# Patient Record
Sex: Female | Born: 1950 | Race: White | Hispanic: No | Marital: Married | Smoking: Never smoker
Health system: Southern US, Community
[De-identification: ages and names within clinical notes are randomized; demographics above are authoritative.]

## PROBLEM LIST (undated history)

## (undated) DIAGNOSIS — F32A Depression, unspecified: Secondary | ICD-10-CM

## (undated) DIAGNOSIS — T4145XA Adverse effect of unspecified anesthetic, initial encounter: Secondary | ICD-10-CM

## (undated) DIAGNOSIS — E039 Hypothyroidism, unspecified: Secondary | ICD-10-CM

## (undated) DIAGNOSIS — I2510 Atherosclerotic heart disease of native coronary artery without angina pectoris: Secondary | ICD-10-CM

## (undated) DIAGNOSIS — F329 Major depressive disorder, single episode, unspecified: Secondary | ICD-10-CM

## (undated) DIAGNOSIS — T8859XA Other complications of anesthesia, initial encounter: Secondary | ICD-10-CM

## (undated) DIAGNOSIS — C50919 Malignant neoplasm of unspecified site of unspecified female breast: Secondary | ICD-10-CM

## (undated) DIAGNOSIS — R51 Headache: Secondary | ICD-10-CM

## (undated) HISTORY — DX: Atherosclerotic heart disease of native coronary artery without angina pectoris: I25.10

## (undated) HISTORY — PX: OTHER SURGICAL HISTORY: SHX169

## (undated) HISTORY — PX: FACELIFT: SHX1566

## (undated) HISTORY — DX: Malignant neoplasm of unspecified site of unspecified female breast: C50.919

---

## 1998-08-04 ENCOUNTER — Other Ambulatory Visit: Admission: RE | Admit: 1998-08-04 | Discharge: 1998-08-04 | Payer: Self-pay | Admitting: Orthopedic Surgery

## 1998-12-27 ENCOUNTER — Other Ambulatory Visit: Admission: RE | Admit: 1998-12-27 | Discharge: 1998-12-27 | Payer: Self-pay | Admitting: Gynecology

## 1999-06-25 ENCOUNTER — Encounter: Payer: Self-pay | Admitting: Gynecology

## 1999-06-25 ENCOUNTER — Encounter: Admission: RE | Admit: 1999-06-25 | Discharge: 1999-06-25 | Payer: Self-pay | Admitting: Gynecology

## 1999-07-16 ENCOUNTER — Other Ambulatory Visit: Admission: RE | Admit: 1999-07-16 | Discharge: 1999-07-16 | Payer: Self-pay | Admitting: Gynecology

## 1999-09-18 ENCOUNTER — Encounter: Admission: RE | Admit: 1999-09-18 | Discharge: 1999-09-18 | Payer: Self-pay | Admitting: Gynecology

## 1999-09-18 ENCOUNTER — Encounter: Payer: Self-pay | Admitting: Gynecology

## 2000-07-09 ENCOUNTER — Other Ambulatory Visit: Admission: RE | Admit: 2000-07-09 | Discharge: 2000-07-09 | Payer: Self-pay | Admitting: Gynecology

## 2000-08-05 ENCOUNTER — Encounter (INDEPENDENT_AMBULATORY_CARE_PROVIDER_SITE_OTHER): Payer: Self-pay | Admitting: Specialist

## 2000-08-05 ENCOUNTER — Other Ambulatory Visit: Admission: RE | Admit: 2000-08-05 | Discharge: 2000-08-05 | Payer: Self-pay | Admitting: Gynecology

## 2000-09-29 ENCOUNTER — Encounter: Admission: RE | Admit: 2000-09-29 | Discharge: 2000-09-29 | Payer: Self-pay | Admitting: Gynecology

## 2000-09-29 ENCOUNTER — Encounter: Payer: Self-pay | Admitting: Gynecology

## 2001-03-17 ENCOUNTER — Other Ambulatory Visit: Admission: RE | Admit: 2001-03-17 | Discharge: 2001-03-17 | Payer: Self-pay | Admitting: Gynecology

## 2001-04-27 ENCOUNTER — Ambulatory Visit (HOSPITAL_COMMUNITY): Admission: RE | Admit: 2001-04-27 | Discharge: 2001-04-27 | Payer: Self-pay | Admitting: Gastroenterology

## 2001-06-01 ENCOUNTER — Ambulatory Visit (HOSPITAL_COMMUNITY): Admission: RE | Admit: 2001-06-01 | Discharge: 2001-06-01 | Payer: Self-pay | Admitting: Gastroenterology

## 2001-09-17 ENCOUNTER — Other Ambulatory Visit: Admission: RE | Admit: 2001-09-17 | Discharge: 2001-09-17 | Payer: Self-pay | Admitting: Gynecology

## 2001-10-01 ENCOUNTER — Encounter: Payer: Self-pay | Admitting: Gynecology

## 2001-10-01 ENCOUNTER — Encounter: Admission: RE | Admit: 2001-10-01 | Discharge: 2001-10-01 | Payer: Self-pay | Admitting: Gynecology

## 2002-03-17 ENCOUNTER — Other Ambulatory Visit: Admission: RE | Admit: 2002-03-17 | Discharge: 2002-03-17 | Payer: Self-pay | Admitting: Gynecology

## 2002-09-23 ENCOUNTER — Other Ambulatory Visit: Admission: RE | Admit: 2002-09-23 | Discharge: 2002-09-23 | Payer: Self-pay | Admitting: Gynecology

## 2002-10-04 ENCOUNTER — Encounter: Admission: RE | Admit: 2002-10-04 | Discharge: 2002-10-04 | Payer: Self-pay | Admitting: Gynecology

## 2002-10-04 ENCOUNTER — Encounter: Payer: Self-pay | Admitting: Gynecology

## 2003-03-29 ENCOUNTER — Other Ambulatory Visit: Admission: RE | Admit: 2003-03-29 | Discharge: 2003-03-29 | Payer: Self-pay | Admitting: Gynecology

## 2003-09-07 ENCOUNTER — Other Ambulatory Visit: Admission: RE | Admit: 2003-09-07 | Discharge: 2003-09-07 | Payer: Self-pay | Admitting: Gynecology

## 2003-10-05 ENCOUNTER — Encounter: Admission: RE | Admit: 2003-10-05 | Discharge: 2003-10-05 | Payer: Self-pay | Admitting: Gynecology

## 2004-03-13 ENCOUNTER — Other Ambulatory Visit: Admission: RE | Admit: 2004-03-13 | Discharge: 2004-03-13 | Payer: Self-pay | Admitting: Gynecology

## 2004-05-22 ENCOUNTER — Ambulatory Visit: Payer: Self-pay | Admitting: Sports Medicine

## 2004-08-07 ENCOUNTER — Encounter: Admission: RE | Admit: 2004-08-07 | Discharge: 2004-08-07 | Payer: Self-pay | Admitting: Orthopedic Surgery

## 2004-10-08 ENCOUNTER — Encounter: Admission: RE | Admit: 2004-10-08 | Discharge: 2004-10-08 | Payer: Self-pay | Admitting: Gynecology

## 2004-10-17 ENCOUNTER — Other Ambulatory Visit: Admission: RE | Admit: 2004-10-17 | Discharge: 2004-10-17 | Payer: Self-pay | Admitting: Gynecology

## 2005-04-30 ENCOUNTER — Other Ambulatory Visit: Admission: RE | Admit: 2005-04-30 | Discharge: 2005-04-30 | Payer: Self-pay | Admitting: Gynecology

## 2005-07-31 ENCOUNTER — Encounter: Admission: RE | Admit: 2005-07-31 | Discharge: 2005-07-31 | Payer: Self-pay | Admitting: Neurology

## 2005-10-09 ENCOUNTER — Encounter: Admission: RE | Admit: 2005-10-09 | Discharge: 2005-10-09 | Payer: Self-pay | Admitting: Gynecology

## 2005-12-02 ENCOUNTER — Other Ambulatory Visit: Admission: RE | Admit: 2005-12-02 | Discharge: 2005-12-02 | Payer: Self-pay | Admitting: Gynecology

## 2006-10-20 ENCOUNTER — Encounter: Admission: RE | Admit: 2006-10-20 | Discharge: 2006-10-20 | Payer: Self-pay | Admitting: Gynecology

## 2007-01-02 ENCOUNTER — Ambulatory Visit: Payer: Self-pay | Admitting: Sports Medicine

## 2007-01-02 DIAGNOSIS — M21611 Bunion of right foot: Secondary | ICD-10-CM | POA: Insufficient documentation

## 2007-01-02 DIAGNOSIS — M2170 Unequal limb length (acquired), unspecified site: Secondary | ICD-10-CM | POA: Insufficient documentation

## 2007-03-05 HISTORY — PX: KNEE ARTHROSCOPY: SUR90

## 2007-10-29 ENCOUNTER — Encounter: Admission: RE | Admit: 2007-10-29 | Discharge: 2007-10-29 | Payer: Self-pay | Admitting: Gynecology

## 2008-10-31 ENCOUNTER — Encounter: Admission: RE | Admit: 2008-10-31 | Discharge: 2008-10-31 | Payer: Self-pay | Admitting: Gynecology

## 2009-11-01 ENCOUNTER — Encounter: Admission: RE | Admit: 2009-11-01 | Discharge: 2009-11-01 | Payer: Self-pay | Admitting: Gynecology

## 2010-07-17 ENCOUNTER — Other Ambulatory Visit: Payer: Self-pay | Admitting: Gynecology

## 2010-07-17 ENCOUNTER — Ambulatory Visit (HOSPITAL_BASED_OUTPATIENT_CLINIC_OR_DEPARTMENT_OTHER)
Admission: RE | Admit: 2010-07-17 | Discharge: 2010-07-17 | Disposition: A | Discharge status: Home / Self Care | Payer: 59 | Source: Ambulatory Visit | Attending: Gynecology | Admitting: Gynecology

## 2010-07-17 DIAGNOSIS — D250 Submucous leiomyoma of uterus: Secondary | ICD-10-CM | POA: Insufficient documentation

## 2010-07-17 DIAGNOSIS — E039 Hypothyroidism, unspecified: Secondary | ICD-10-CM | POA: Insufficient documentation

## 2010-07-17 DIAGNOSIS — R002 Palpitations: Secondary | ICD-10-CM | POA: Insufficient documentation

## 2010-07-17 DIAGNOSIS — N950 Postmenopausal bleeding: Secondary | ICD-10-CM | POA: Insufficient documentation

## 2010-07-17 DIAGNOSIS — N840 Polyp of corpus uteri: Secondary | ICD-10-CM | POA: Insufficient documentation

## 2010-07-17 DIAGNOSIS — Z79899 Other long term (current) drug therapy: Secondary | ICD-10-CM | POA: Insufficient documentation

## 2010-07-17 LAB — POCT HEMOGLOBIN-HEMACUE: Hemoglobin: 14.5 g/dL (ref 12.0–15.0)

## 2010-07-20 NOTE — Procedures (Signed)
[facility]  Patient:    Crystal Galvan, Crystal Galvan Visit Number: [id number] MRN: [id number]          Service Type: END Location: ENDO Attending Physician:  Dennison Bulla Ii Dictated by:   Verlin Grills, M.D. Proc. Date: 06/01/01 Admit Date:  06/01/2001 Discharge Date: 06/01/2001   CC:         Theressa Millard, M.D.  Gretta Cool, M.D.   Procedure Report  This is a redictation.  PROCEDURE:  Screening colonoscopy.  PROCEDURE INDICATION:  Ms. Crystal Galvan is a 60 year old female born 22-Jul-1950.  She is referred to me by Dr. Theressa Millard for a screening colonoscopy with polypectomy to prevent colon cancer.  I discussed with Crystal Galvan the complications associated with colonoscopy and polypectomy, including a 15 per 1000 risk of bleeding and 4 per 1000 risk of colon perforation requiring surgical repair.  Crystal Galvan has signed the operative permit.  ENDOSCOPIST:  Verlin Grills, M.D.  PREMEDICATION:  Demerol 50 mg, Versed 7 mg.  ENDOSCOPE:  Olympus pediatric colonoscope.  DESCRIPTION OF PROCEDURE:  After obtaining informed consent, Crystal Galvan was placed in the left lateral decubitus position.  I administered intravenous Demerol and intravenous Versed to achieve conscious sedation for the procedure.  The patients blood pressure, oxygen saturation, and cardiac rhythm were monitored throughout the procedure and documented in the medical record.  Anal inspection was normal.  Digital rectal exam was normal.  The Olympus pediatric video colonoscope was introduced into the rectum and easily advanced to the cecum.  Colonic preparation for the exam today was satisfactory.  RECTUM:  Normal.  SIGMOID COLON AND DESCENDING COLON:  Left colonic diverticulosis.  SPLENIC FLEXURE:  Normal.  TRANSVERSE COLON:  Normal.  HEPATIC FLEXURE:  Normal.  ASCENDING COLON:  Normal.  CECUM AND ILEOCECAL VALVE:  Normal.  ASSESSMENT: 1. Left colonic  diverticulosis. 2. Otherwise normal screening proctocolonoscopy to the cecum. 3. No endoscopic evidence for the presence of colorectal neoplasia. Dictated by:   Verlin Grills, M.D. Attending Physician:  Dennison Bulla Ii DD:  06/15/01 TD:  06/15/01 Job: [id number]

## 2010-08-17 NOTE — Op Note (Signed)
  Crystal Galvan, Crystal Galvan                  ACCOUNT NO.:  [id number]  MEDICAL RECORD NO.:  [id number]         PATIENT TYPE:  AMB  LOCATION:  [facility]                         FACILITY:  [facility]  PHYSICIAN:  Gretta Cool, M.D. DATE OF BIRTH:  [birth year]  DATE OF PROCEDURE:  07/17/2010 DATE OF DISCHARGE:                              OPERATIVE REPORT   PREOPERATIVE DIAGNOSIS:  Submucous leiomyomata with postmenopausal bleeding, recurrent and previous resection of submucous fibroid in 1996.  SURGEON:  Gretta Cool, MD  POSTOPERATIVE DIAGNOSIS:  Submucous leiomyomata with postmenopausal bleeding, recurrent and previous resection of submucous fibroid in 1996.  ANESTHESIA:  IV sedation, paracervical block.  DESCRIPTION OF PROCEDURE:  Under satisfactory anesthesia as above with the patient prepped and draped in [employer] with her bladder drained preoperatively, the weighted speculum placed in the vagina and cervix grasped with a single-tooth tenaculum, pulled down and viewed. It was then anesthetized by paracervical block with 1% Xylocaine 15 cc. At this point, the cervix was progressively dilated with a series of Pratt dilators to 33.  Hysteroscope was then introduced and cavity examined.  There was recurrence of the submucous fibroid.  She had one previously resected totally in 1996.  There was one that now fills the entire cavity attached to the anterior fundal wall.  The submucous fibroids that were progressively resected so as to remove it entirely. Much of the endometrial wall also removed in the process of removing the submucous fibroid.  There was no other evidence of fibroids bulging into the cavity.  At this point, we reduced pressure.  There was no further bleeding.  There were no polyps or other evidence of abnormality in the endometrial cavity.  Photographs were taken.  At the end of the procedure, there was no significant bleeding.  The fluid deficit is approximately 140  cc.  COMPLICATIONS:  None.          ______________________________ Gretta Cool, M.D.     CWL/MEDQ  D:  07/17/2010  T:  07/17/2010  Job:  [id number]  cc:   Theressa Millard, M.D. Fax: [phone]  Electronically Signed by Beather Arbour M.D. on 08/17/2010 11:50:31 AM

## 2010-09-25 ENCOUNTER — Other Ambulatory Visit: Payer: Self-pay | Admitting: Gynecology

## 2010-09-25 DIAGNOSIS — Z1231 Encounter for screening mammogram for malignant neoplasm of breast: Secondary | ICD-10-CM

## 2010-11-09 ENCOUNTER — Ambulatory Visit
Admission: RE | Admit: 2010-11-09 | Discharge: 2010-11-09 | Disposition: A | Discharge status: Home / Self Care | Payer: 59 | Source: Ambulatory Visit | Attending: Gynecology | Admitting: Gynecology

## 2010-11-09 DIAGNOSIS — Z1231 Encounter for screening mammogram for malignant neoplasm of breast: Secondary | ICD-10-CM

## 2011-01-02 ENCOUNTER — Other Ambulatory Visit: Payer: Self-pay | Admitting: Gynecology

## 2011-10-01 ENCOUNTER — Other Ambulatory Visit: Payer: Self-pay | Admitting: Gynecology

## 2011-10-01 DIAGNOSIS — Z1231 Encounter for screening mammogram for malignant neoplasm of breast: Secondary | ICD-10-CM

## 2011-11-12 ENCOUNTER — Other Ambulatory Visit: Payer: Self-pay | Admitting: Gynecology

## 2011-11-12 ENCOUNTER — Ambulatory Visit
Admission: RE | Admit: 2011-11-12 | Discharge: 2011-11-12 | Disposition: A | Discharge status: Home / Self Care | Payer: 59 | Source: Ambulatory Visit | Attending: Gynecology | Admitting: Gynecology

## 2011-11-12 ENCOUNTER — Ambulatory Visit
Admission: RE | Admit: 2011-11-12 | Discharge: 2011-11-12 | Disposition: A | Discharge status: Home / Self Care | Payer: 59 | Source: Ambulatory Visit

## 2011-11-12 DIAGNOSIS — Z1231 Encounter for screening mammogram for malignant neoplasm of breast: Secondary | ICD-10-CM

## 2012-02-04 ENCOUNTER — Other Ambulatory Visit: Payer: Self-pay | Admitting: Gynecology

## 2012-04-30 ENCOUNTER — Other Ambulatory Visit: Payer: Self-pay | Admitting: Gastroenterology

## 2012-05-22 ENCOUNTER — Encounter (HOSPITAL_COMMUNITY): Payer: Self-pay | Admitting: *Deleted

## 2012-05-28 ENCOUNTER — Encounter (HOSPITAL_COMMUNITY): Payer: Self-pay | Admitting: Pharmacy Technician

## 2012-06-01 ENCOUNTER — Encounter (HOSPITAL_COMMUNITY)
Admission: RE | Disposition: A | Discharge status: Home / Self Care | Payer: Self-pay | Source: Ambulatory Visit | Attending: Gastroenterology

## 2012-06-01 ENCOUNTER — Encounter (HOSPITAL_COMMUNITY): Payer: Self-pay | Admitting: *Deleted

## 2012-06-01 ENCOUNTER — Ambulatory Visit (HOSPITAL_COMMUNITY)
Admission: RE | Admit: 2012-06-01 | Discharge: 2012-06-01 | Disposition: A | Discharge status: Home / Self Care | Payer: BC Managed Care – PPO | Source: Ambulatory Visit | Attending: Gastroenterology | Admitting: Gastroenterology

## 2012-06-01 ENCOUNTER — Ambulatory Visit (HOSPITAL_COMMUNITY): Payer: BC Managed Care – PPO | Admitting: Anesthesiology

## 2012-06-01 ENCOUNTER — Encounter (HOSPITAL_COMMUNITY): Payer: Self-pay | Admitting: Anesthesiology

## 2012-06-01 DIAGNOSIS — K5730 Diverticulosis of large intestine without perforation or abscess without bleeding: Secondary | ICD-10-CM | POA: Insufficient documentation

## 2012-06-01 DIAGNOSIS — Z881 Allergy status to other antibiotic agents status: Secondary | ICD-10-CM | POA: Insufficient documentation

## 2012-06-01 DIAGNOSIS — Z79899 Other long term (current) drug therapy: Secondary | ICD-10-CM | POA: Insufficient documentation

## 2012-06-01 DIAGNOSIS — E039 Hypothyroidism, unspecified: Secondary | ICD-10-CM | POA: Insufficient documentation

## 2012-06-01 DIAGNOSIS — E559 Vitamin D deficiency, unspecified: Secondary | ICD-10-CM | POA: Insufficient documentation

## 2012-06-01 DIAGNOSIS — Z1211 Encounter for screening for malignant neoplasm of colon: Secondary | ICD-10-CM | POA: Insufficient documentation

## 2012-06-01 HISTORY — DX: Other complications of anesthesia, initial encounter: T88.59XA

## 2012-06-01 HISTORY — DX: Headache: R51

## 2012-06-01 HISTORY — DX: Major depressive disorder, single episode, unspecified: F32.9

## 2012-06-01 HISTORY — DX: Adverse effect of unspecified anesthetic, initial encounter: T41.45XA

## 2012-06-01 HISTORY — DX: Depression, unspecified: F32.A

## 2012-06-01 HISTORY — DX: Hypothyroidism, unspecified: E03.9

## 2012-06-01 HISTORY — PX: COLONOSCOPY WITH PROPOFOL: SHX5780

## 2012-06-01 SURGERY — COLONOSCOPY WITH PROPOFOL
Anesthesia: Monitor Anesthesia Care

## 2012-06-01 MED ORDER — ONDANSETRON HCL 4 MG/2ML IJ SOLN
INTRAMUSCULAR | Status: DC | PRN
  Administered 2012-06-01: 4 mg via INTRAVENOUS

## 2012-06-01 MED ORDER — MIDAZOLAM HCL 5 MG/5ML IJ SOLN
INTRAMUSCULAR | Status: DC | PRN
  Administered 2012-06-01 (×2): 1 mg via INTRAVENOUS

## 2012-06-01 MED ORDER — PROPOFOL 10 MG/ML IV EMUL
INTRAVENOUS | Status: DC | PRN
  Administered 2012-06-01: 140 ug/kg/min via INTRAVENOUS

## 2012-06-01 MED ORDER — LACTATED RINGERS IV SOLN
INTRAVENOUS | Status: DC
  Administered 2012-06-01: 1000 mL via INTRAVENOUS

## 2012-06-01 MED ORDER — KETAMINE HCL 10 MG/ML IJ SOLN
INTRAMUSCULAR | Status: DC | PRN
  Administered 2012-06-01: 10 mg via INTRAVENOUS

## 2012-06-01 MED ORDER — FENTANYL CITRATE 0.05 MG/ML IJ SOLN
INTRAMUSCULAR | Status: DC | PRN
  Administered 2012-06-01 (×2): 50 ug via INTRAVENOUS

## 2012-06-01 MED ORDER — SODIUM CHLORIDE 0.9 % IV SOLN: INTRAVENOUS | Status: DC

## 2012-06-01 SURGICAL SUPPLY — 22 items

## 2012-06-01 NOTE — Anesthesia Preprocedure Evaluation (Signed)
Anesthesia Evaluation  Patient identified by MRN, date of birth, ID band Patient awake    Reviewed: Allergy & Precautions, H&P , NPO status , Patient's Chart, lab work & pertinent test results  Airway Mallampati: II TM Distance: >3 FB Neck ROM: Full    Dental no notable dental hx.    Pulmonary neg pulmonary ROS,  breath sounds clear to auscultation  Pulmonary exam normal       Cardiovascular negative cardio ROS  Rhythm:Regular Rate:Normal     Neuro/Psych negative neurological ROS  negative psych ROS   GI/Hepatic negative GI ROS, Neg liver ROS,   Endo/Other  negative endocrine ROSHypothyroidism   Renal/GU negative Renal ROS  negative genitourinary   Musculoskeletal negative musculoskeletal ROS (+)   Abdominal   Peds negative pediatric ROS (+)  Hematology negative hematology ROS (+)   Anesthesia Other Findings Upper front veneers  Reproductive/Obstetrics negative OB ROS                           Anesthesia Physical Anesthesia Plan  ASA: II  Anesthesia Plan: MAC   Post-op Pain Management:    Induction:   Airway Management Planned: Simple Face Mask  Additional Equipment:   Intra-op Plan:   Post-operative Plan:   Informed Consent: I have reviewed the patients History and Physical, chart, labs and discussed the procedure including the risks, benefits and alternatives for the proposed anesthesia with the patient or authorized representative who has indicated his/her understanding and acceptance.   Dental advisory given  Plan Discussed with: CRNA  Anesthesia Plan Comments:         Anesthesia Quick Evaluation

## 2012-06-01 NOTE — Anesthesia Postprocedure Evaluation (Signed)
  Anesthesia Post-op Note  Patient: Crystal Galvan  Procedure(s) Performed: Procedure(s) (LRB): COLONOSCOPY WITH PROPOFOL (N/A)  Patient Location: PACU  Anesthesia Type: MAC  Level of Consciousness: awake and alert   Airway and Oxygen Therapy: Patient Spontanous Breathing  Post-op Pain: mild  Post-op Assessment: Post-op Vital signs reviewed, Patient's Cardiovascular Status Stable, Respiratory Function Stable, Patent Airway and No signs of Nausea or vomiting  Last Vitals:  Filed Vitals:   06/01/12 1326  BP:   Temp: 36.6 C  Resp:     Post-op Vital Signs: stable   Complications: No apparent anesthesia complications

## 2012-06-01 NOTE — Transfer of Care (Signed)
Immediate Anesthesia Transfer of Care Note  Patient: Crystal Galvan  Procedure(s) Performed: Procedure(s): COLONOSCOPY WITH PROPOFOL (N/A)  Patient Location: PACU  Anesthesia Type:MAC  Level of Consciousness: awake, alert  and oriented  Airway & Oxygen Therapy: Patient Spontanous Breathing and Patient connected to face mask oxygen  Post-op Assessment: Report given to PACU RN and Post -op Vital signs reviewed and stable  Post vital signs: Reviewed and stable  Complications: No apparent anesthesia complications

## 2012-06-01 NOTE — H&P (Signed)
  Procedure repeat screening colonoscopy  History: The patient is a 62 year old female born Sep 16, 1950. The patient underwent a normal screening colonoscopy in March 2003. She is scheduled to undergo a repeat screening colonoscopy with polypectomy to prevent colon cancer.  Chronic medications: Prometrium. Fish oil. Cymbalta. Levothyroxin. Fluticasone. Vivelle-Dot.  Past medical history: Vitamin D. deficiency. Hypothyroidism. History of diverticulitis. Rhinoplasty.  Habits: The patient has never smoked cigarettes and consumes alcohol in moderation.  Allergies: Erythromycin causes nausea. Septra caused an uncharacterized side effect   Plan: Proceed with screening colonoscopy.

## 2012-06-01 NOTE — Op Note (Signed)
Procedure: Screening colonoscopy  Endoscopist: Danise Edge  Premedication: Propofol administered by anesthesia  Procedure: The patient was placed in the left lateral decubitus position. Anal inspection and digital rectal exam were normal. The Pentax pediatric colonoscope was introduced into the rectum and easily advanced to the cecum. A normal-appearing ileocecal valve and appendiceal orifice were identified. Colonic preparation for the exam today was good.  Rectum. Normal. Retroflexed view of the distal rectum normal.  Sigmoid colon and descending colon. Left colonic diverticulosis  Splenic flexure. Normal.  Transverse colon. Normal.  Hepatic flexure. Normal.  Ascending colon. Normal.  Cecum and ileocecal valve. Normal.  Assessment: Normal screening proctocolonoscopy to the cecum  Recommendations: Schedule repeat screening colonoscopy in 10 years

## 2012-06-01 NOTE — Preoperative (Signed)
Beta Blockers   Reason not to administer Beta Blockers:Not Applicable 

## 2012-06-02 ENCOUNTER — Encounter (HOSPITAL_COMMUNITY): Payer: Self-pay | Admitting: Gastroenterology

## 2012-09-16 ENCOUNTER — Ambulatory Visit (INDEPENDENT_AMBULATORY_CARE_PROVIDER_SITE_OTHER): Payer: BC Managed Care – PPO | Admitting: Sports Medicine

## 2012-09-16 VITALS — BP 128/77 | Ht 68.0 in | Wt 140.0 lb

## 2012-09-16 DIAGNOSIS — S39012A Strain of muscle, fascia and tendon of lower back, initial encounter: Secondary | ICD-10-CM

## 2012-09-16 DIAGNOSIS — S335XXA Sprain of ligaments of lumbar spine, initial encounter: Secondary | ICD-10-CM

## 2012-09-16 MED ORDER — KETOROLAC TROMETHAMINE 60 MG/2ML IM SOLN
60.0000 mg | Freq: Once | INTRAMUSCULAR | Status: AC
  Administered 2012-09-16: 60 mg via INTRAMUSCULAR

## 2012-09-16 MED ORDER — KETOROLAC TROMETHAMINE 60 MG/2ML IM SOLN: 60.0000 mg | Freq: Once | INTRAMUSCULAR | Status: DC

## 2012-09-16 NOTE — Progress Notes (Signed)
  Subjective:    Patient ID: Crystal Galvan, female    DOB: 1950-06-25, 62 y.o.   MRN: [id number]  HPI chief complaint: Low back pain   62 year old female comes in today complaining of 2 days of low back pain. She suffered an acute injury while playing tennis. Had immediate onset of right-sided low back pain and began to develop spasming thereafter. She has tried chiropractic treatment as well as acupuncture but her spasms continue. She will get some radiating discomfort around to the front of her hip but she denies radiating pain down the legs. No associated numbness or tingling. No change in bowel or bladder. No prior problems with her back in the past. The spasm is worse when sitting or with turning over in bed. She is been using heat which has been helpful and has also been using over-the-counter Aleve 1 pill when necessary which she also notes to be of some benefit.  Past medical history is reviewed Medications are reviewed No known drug allergies    Review of Systems     Objective:   Physical Exam Well-developed, mild distressed due to her pain. Awake alert and oriented x3. Vital signs are reviewed  Patient has limited lumbar range of motion secondary to pain. There is some mild spasm of the right paraspinal musculature. No tenderness to palpation or percussion along the lumbar midline. Negative straight leg raise bilaterally. Negative log roll bilaterally. Strength is 5/5 both lower extremities. Reflexes are 2/4 at the Achilles and patellar tendons bilaterally. Station is intact to light touch grossly. Patient walks with a very upright posture due to her pain.       Assessment & Plan:  1. Low back pain secondary to lumbar strain with muscle spasm  60 mg of Toradol IM. I've asked her to increase her Aleve to 2 pills twice daily with food for the next 2-3 days. Continue with heat but I recommended moist heat over dry heat. We talked about the possibility of physical therapy if symptoms  persist. Gentle range of motion as symptoms allow. Followup if symptoms persist or worsen.

## 2012-09-17 ENCOUNTER — Ambulatory Visit (INDEPENDENT_AMBULATORY_CARE_PROVIDER_SITE_OTHER): Payer: BC Managed Care – PPO | Admitting: Sports Medicine

## 2012-09-17 VITALS — BP 149/69 | Ht 68.0 in | Wt 140.0 lb

## 2012-09-17 DIAGNOSIS — S335XXA Sprain of ligaments of lumbar spine, initial encounter: Secondary | ICD-10-CM

## 2012-09-17 DIAGNOSIS — S39012A Strain of muscle, fascia and tendon of lower back, initial encounter: Secondary | ICD-10-CM

## 2012-09-17 MED ORDER — METHYLPREDNISOLONE ACETATE 80 MG/ML IJ SUSP
80.0000 mg | Freq: Once | INTRAMUSCULAR | Status: AC
  Administered 2012-09-17: 80 mg via INTRAMUSCULAR

## 2012-09-17 NOTE — Progress Notes (Signed)
Patient ID: Crystal Galvan, female   DOB: October 28, 1950, 62 y.o.   MRN: [id number]  Crystal Galvan comes in today for cortisone injection. We did discuss this possibility during yesterday's visit. She has received about 50% improvement with the IM Toradol injection which was administered yesterday she would like to go ahead with the cortisone injection as well. She is leaving for a trip to the [place] tomorrow. We will go ahead and inject her with 80 mg of Depo-Medrol IM. If symptoms persist thereafter code consider formal physical therapy upon her return to [place]. Otherwise, followup when necessary.

## 2012-10-30 ENCOUNTER — Other Ambulatory Visit: Payer: Self-pay

## 2012-11-06 ENCOUNTER — Other Ambulatory Visit: Payer: Self-pay

## 2012-11-06 DIAGNOSIS — Z1231 Encounter for screening mammogram for malignant neoplasm of breast: Secondary | ICD-10-CM

## 2012-12-01 ENCOUNTER — Ambulatory Visit
Admission: RE | Admit: 2012-12-01 | Discharge: 2012-12-01 | Disposition: A | Discharge status: Home / Self Care | Payer: BC Managed Care – PPO | Source: Ambulatory Visit

## 2012-12-01 DIAGNOSIS — Z1231 Encounter for screening mammogram for malignant neoplasm of breast: Secondary | ICD-10-CM

## 2013-11-01 ENCOUNTER — Other Ambulatory Visit: Payer: Self-pay

## 2013-11-01 DIAGNOSIS — Z1231 Encounter for screening mammogram for malignant neoplasm of breast: Secondary | ICD-10-CM

## 2013-12-14 ENCOUNTER — Ambulatory Visit: Payer: BC Managed Care – PPO

## 2013-12-15 ENCOUNTER — Ambulatory Visit
Admission: RE | Admit: 2013-12-15 | Discharge: 2013-12-15 | Disposition: A | Discharge status: Home / Self Care | Payer: BC Managed Care – PPO | Source: Ambulatory Visit

## 2013-12-15 DIAGNOSIS — Z1231 Encounter for screening mammogram for malignant neoplasm of breast: Secondary | ICD-10-CM

## 2014-01-14 ENCOUNTER — Other Ambulatory Visit: Payer: Self-pay | Admitting: Internal Medicine

## 2014-01-14 DIAGNOSIS — R1032 Left lower quadrant pain: Secondary | ICD-10-CM

## 2014-01-19 ENCOUNTER — Ambulatory Visit
Admission: RE | Admit: 2014-01-19 | Discharge: 2014-01-19 | Disposition: A | Discharge status: Home / Self Care | Payer: BC Managed Care – PPO | Source: Ambulatory Visit | Attending: Internal Medicine | Admitting: Internal Medicine

## 2014-01-19 DIAGNOSIS — R1032 Left lower quadrant pain: Secondary | ICD-10-CM

## 2014-05-03 ENCOUNTER — Other Ambulatory Visit: Payer: Self-pay | Admitting: Dermatology

## 2014-11-21 ENCOUNTER — Other Ambulatory Visit: Payer: Self-pay

## 2014-11-21 DIAGNOSIS — Z1231 Encounter for screening mammogram for malignant neoplasm of breast: Secondary | ICD-10-CM

## 2014-12-07 ENCOUNTER — Ambulatory Visit (INDEPENDENT_AMBULATORY_CARE_PROVIDER_SITE_OTHER): Payer: BLUE CROSS/BLUE SHIELD | Admitting: Family Medicine

## 2014-12-07 ENCOUNTER — Encounter: Payer: Self-pay | Admitting: Family Medicine

## 2014-12-07 VITALS — BP 120/69 | HR 60 | Ht 68.0 in | Wt 138.0 lb

## 2014-12-07 DIAGNOSIS — R269 Unspecified abnormalities of gait and mobility: Secondary | ICD-10-CM | POA: Insufficient documentation

## 2014-12-07 NOTE — Progress Notes (Signed)
  Crystal Galvan - 64 y.o. female MRN [id number]  Date of birth: 1950-06-17 Crystal Galvan is a 64 y.o. female who presents today for abnormality of gait.  Abnormality of gait, initial visit-patient presents today for orthotics to correct her underlying gait abnormality. She has pes planus with slight collapse the transverse arch and splaying of her toes. She has a slight external rotation of the right leg with ambulation. She does play a decent amount of tenderness and is looking for a cushioned orthotic to help with this to decrease back pain.  PMHx - Updated and reviewed.  Contributory factors include: Bunion, hypothyroidism PSHx - Updated and reviewed.  Contributory factors include:  Right knee arthroscopy with meniscectomy in 2009 FHx - Updated and reviewed.  Contributory factors include:  Noncontributory Medications - vitamin D, estradiol, fish oil, Synthroid   ROS Per HPI. 12 point negative otherwise Exam:  Filed Vitals:   12/07/14 1416  BP: 120/69  Pulse: 60   Gen: NAD Cardiorespiratory - Normal respiratory effort/rate.  RRR  feet: Pes planus right greater than left with collapse of transverse arch. She has no areas of ecchymosis or soft tissue edema. Full range of motion in the ankles and toes. Muscle strength 5 out of 5 bilateral lower extremity. Neurovascular intact distally.

## 2014-12-07 NOTE — Assessment & Plan Note (Signed)
Patient was fitted for a standard, cushioned, semi-rigid orthotic. The orthotic was heated and afterward the patient stood on the orthotic blank positioned on the orthotic stand. The patient was positioned in subtalar neutral position and 10 degrees of ankle dorsiflexion in a weight bearing stance. After completion of molding, a stable base was applied to the orthotic blank. The blank was ground to a stable position for weight bearing. Size: 8 Base: Blue EVA Additional Posting and Padding: None The patient ambulated these, and they were very comfortable.  I spent 45 minutes with this patient, greater than 50% was face-to-face time counseling regarding the below diagnosis.  Her orthotic helped correct her gait with less functional pes planus and external rotation of the R leg.

## 2014-12-28 ENCOUNTER — Ambulatory Visit
Admission: RE | Admit: 2014-12-28 | Discharge: 2014-12-28 | Disposition: A | Discharge status: Home / Self Care | Payer: BLUE CROSS/BLUE SHIELD | Source: Ambulatory Visit

## 2014-12-28 DIAGNOSIS — Z1231 Encounter for screening mammogram for malignant neoplasm of breast: Secondary | ICD-10-CM

## 2015-07-03 DIAGNOSIS — L700 Acne vulgaris: Secondary | ICD-10-CM | POA: Diagnosis not present

## 2015-07-14 DIAGNOSIS — M47812 Spondylosis without myelopathy or radiculopathy, cervical region: Secondary | ICD-10-CM | POA: Diagnosis not present

## 2015-07-24 DIAGNOSIS — F331 Major depressive disorder, recurrent, moderate: Secondary | ICD-10-CM | POA: Diagnosis not present

## 2015-08-02 ENCOUNTER — Encounter: Payer: Self-pay | Admitting: Sports Medicine

## 2015-08-02 ENCOUNTER — Ambulatory Visit (INDEPENDENT_AMBULATORY_CARE_PROVIDER_SITE_OTHER): Payer: BLUE CROSS/BLUE SHIELD | Admitting: Sports Medicine

## 2015-08-02 VITALS — BP 117/62 | HR 60 | Ht 68.0 in | Wt 135.0 lb

## 2015-08-02 DIAGNOSIS — M21611 Bunion of right foot: Secondary | ICD-10-CM | POA: Diagnosis not present

## 2015-08-02 NOTE — Assessment & Plan Note (Signed)
There is moderate inflammatory change over great toe  Consider taping to stabilize during tennis  Orthotics had a 1st ray post and medial heel wedge added to RT only  Try arnica gel tid to toe  I think best conservative options are wide toe box for shoes and good orthotic support/ advised

## 2015-08-02 NOTE — Progress Notes (Signed)
Patient ID: Crystal Galvan, female   DOB: 04/22/1950, 65 y.o.   MRN: [id number]  Patient here for recheck of foot pain/ bunion  Placed in orthotics 12/2014 that have worked well She had used orthotics for years before when exercising However with tennis more Great toe pain  Saw Dr Kern Reap when RT great toe crossing under 2nd toe Manipulation and he was able to get this back straighter Tried kinesio taping and had some pain relief with that Pain relates to dragging toe on forehand stroke in tennis More narrow toe box in older pair of tennis shoes seemed to really worsen this  Soc Hx:  Married/ plays tennis and works out regularly  ROS No other joint related swelling No hx of gout  Pexam NAD BP 117/62 mmHg  Pulse 60  Ht 5\' 8"  (1.727 m)  Wt 135 lb (61.236 kg)  BMI 20.53 kg/m2  RT foot Moderate arch This foot Mild valgus deviation This tends to displace 2nd toe upward at times TTP and mild swelling over MTP 1  LT foot Good motion left great toe No valgus shift Moderate arch  [place] Spurring and bony change at MTP 1 mostly dorsal and medial Small inflamed bursa over dorsal surface with increased doppler flow

## 2015-08-08 DIAGNOSIS — R5381 Other malaise: Secondary | ICD-10-CM | POA: Diagnosis not present

## 2015-08-08 DIAGNOSIS — N951 Menopausal and female climacteric states: Secondary | ICD-10-CM | POA: Diagnosis not present

## 2015-08-08 DIAGNOSIS — E559 Vitamin D deficiency, unspecified: Secondary | ICD-10-CM | POA: Diagnosis not present

## 2015-08-08 DIAGNOSIS — E039 Hypothyroidism, unspecified: Secondary | ICD-10-CM | POA: Diagnosis not present

## 2015-08-28 DIAGNOSIS — Z85828 Personal history of other malignant neoplasm of skin: Secondary | ICD-10-CM | POA: Diagnosis not present

## 2015-08-28 DIAGNOSIS — L820 Inflamed seborrheic keratosis: Secondary | ICD-10-CM | POA: Diagnosis not present

## 2015-08-28 DIAGNOSIS — L570 Actinic keratosis: Secondary | ICD-10-CM | POA: Diagnosis not present

## 2015-08-28 DIAGNOSIS — L720 Epidermal cyst: Secondary | ICD-10-CM | POA: Diagnosis not present

## 2015-08-30 DIAGNOSIS — L659 Nonscarring hair loss, unspecified: Secondary | ICD-10-CM | POA: Diagnosis not present

## 2015-08-30 DIAGNOSIS — L708 Other acne: Secondary | ICD-10-CM | POA: Diagnosis not present

## 2015-08-30 DIAGNOSIS — E039 Hypothyroidism, unspecified: Secondary | ICD-10-CM | POA: Diagnosis not present

## 2015-09-14 DIAGNOSIS — F331 Major depressive disorder, recurrent, moderate: Secondary | ICD-10-CM | POA: Diagnosis not present

## 2015-10-30 DIAGNOSIS — E039 Hypothyroidism, unspecified: Secondary | ICD-10-CM | POA: Diagnosis not present

## 2015-11-13 DIAGNOSIS — E7210 Disorders of sulfur-bearing amino-acid metabolism, unspecified: Secondary | ICD-10-CM | POA: Diagnosis not present

## 2015-11-13 DIAGNOSIS — E039 Hypothyroidism, unspecified: Secondary | ICD-10-CM | POA: Diagnosis not present

## 2015-12-12 ENCOUNTER — Other Ambulatory Visit: Payer: Self-pay | Admitting: Obstetrics and Gynecology

## 2015-12-12 DIAGNOSIS — Z1231 Encounter for screening mammogram for malignant neoplasm of breast: Secondary | ICD-10-CM

## 2015-12-25 DIAGNOSIS — Z85828 Personal history of other malignant neoplasm of skin: Secondary | ICD-10-CM | POA: Diagnosis not present

## 2015-12-25 DIAGNOSIS — L812 Freckles: Secondary | ICD-10-CM | POA: Diagnosis not present

## 2015-12-25 DIAGNOSIS — D1801 Hemangioma of skin and subcutaneous tissue: Secondary | ICD-10-CM | POA: Diagnosis not present

## 2015-12-25 DIAGNOSIS — L570 Actinic keratosis: Secondary | ICD-10-CM | POA: Diagnosis not present

## 2015-12-25 DIAGNOSIS — L821 Other seborrheic keratosis: Secondary | ICD-10-CM | POA: Diagnosis not present

## 2016-01-01 ENCOUNTER — Ambulatory Visit
Admission: RE | Admit: 2016-01-01 | Discharge: 2016-01-01 | Disposition: A | Discharge status: Home / Self Care | Payer: BLUE CROSS/BLUE SHIELD | Source: Ambulatory Visit | Attending: Obstetrics and Gynecology | Admitting: Obstetrics and Gynecology

## 2016-01-01 DIAGNOSIS — Z1231 Encounter for screening mammogram for malignant neoplasm of breast: Secondary | ICD-10-CM

## 2016-02-07 DIAGNOSIS — Z0000 Encounter for general adult medical examination without abnormal findings: Secondary | ICD-10-CM | POA: Diagnosis not present

## 2016-02-07 DIAGNOSIS — Z23 Encounter for immunization: Secondary | ICD-10-CM | POA: Diagnosis not present

## 2016-02-09 DIAGNOSIS — J0110 Acute frontal sinusitis, unspecified: Secondary | ICD-10-CM | POA: Diagnosis not present

## 2016-02-13 DIAGNOSIS — L700 Acne vulgaris: Secondary | ICD-10-CM | POA: Diagnosis not present

## 2016-03-06 DIAGNOSIS — E039 Hypothyroidism, unspecified: Secondary | ICD-10-CM | POA: Diagnosis not present

## 2016-03-06 DIAGNOSIS — Z01419 Encounter for gynecological examination (general) (routine) without abnormal findings: Secondary | ICD-10-CM | POA: Diagnosis not present

## 2016-03-06 DIAGNOSIS — Z1389 Encounter for screening for other disorder: Secondary | ICD-10-CM | POA: Diagnosis not present

## 2016-03-06 DIAGNOSIS — Z124 Encounter for screening for malignant neoplasm of cervix: Secondary | ICD-10-CM | POA: Diagnosis not present

## 2016-03-06 DIAGNOSIS — Z130 Encounter for screening for diseases of the blood and blood-forming organs and certain disorders involving the immune mechanism: Secondary | ICD-10-CM | POA: Diagnosis not present

## 2016-03-11 DIAGNOSIS — E039 Hypothyroidism, unspecified: Secondary | ICD-10-CM | POA: Diagnosis not present

## 2016-03-11 DIAGNOSIS — M6740 Ganglion, unspecified site: Secondary | ICD-10-CM | POA: Diagnosis not present

## 2016-04-11 DIAGNOSIS — E7210 Disorders of sulfur-bearing amino-acid metabolism, unspecified: Secondary | ICD-10-CM | POA: Diagnosis not present

## 2016-05-02 DIAGNOSIS — I340 Nonrheumatic mitral (valve) insufficiency: Secondary | ICD-10-CM | POA: Diagnosis not present

## 2016-05-14 DIAGNOSIS — B029 Zoster without complications: Secondary | ICD-10-CM | POA: Diagnosis not present

## 2016-05-14 DIAGNOSIS — Z85828 Personal history of other malignant neoplasm of skin: Secondary | ICD-10-CM | POA: Diagnosis not present

## 2016-05-14 DIAGNOSIS — L700 Acne vulgaris: Secondary | ICD-10-CM | POA: Diagnosis not present

## 2016-06-14 DIAGNOSIS — E039 Hypothyroidism, unspecified: Secondary | ICD-10-CM | POA: Diagnosis not present

## 2016-09-17 DIAGNOSIS — L820 Inflamed seborrheic keratosis: Secondary | ICD-10-CM | POA: Diagnosis not present

## 2016-09-17 DIAGNOSIS — F325 Major depressive disorder, single episode, in full remission: Secondary | ICD-10-CM | POA: Diagnosis not present

## 2016-09-17 DIAGNOSIS — Z1389 Encounter for screening for other disorder: Secondary | ICD-10-CM | POA: Diagnosis not present

## 2016-09-17 DIAGNOSIS — J328 Other chronic sinusitis: Secondary | ICD-10-CM | POA: Diagnosis not present

## 2016-09-17 DIAGNOSIS — Z85828 Personal history of other malignant neoplasm of skin: Secondary | ICD-10-CM | POA: Diagnosis not present

## 2016-09-17 DIAGNOSIS — L570 Actinic keratosis: Secondary | ICD-10-CM | POA: Diagnosis not present

## 2016-09-17 DIAGNOSIS — E038 Other specified hypothyroidism: Secondary | ICD-10-CM | POA: Diagnosis not present

## 2016-09-17 DIAGNOSIS — L821 Other seborrheic keratosis: Secondary | ICD-10-CM | POA: Diagnosis not present

## 2016-09-17 DIAGNOSIS — Z780 Asymptomatic menopausal state: Secondary | ICD-10-CM | POA: Diagnosis not present

## 2016-09-17 DIAGNOSIS — I788 Other diseases of capillaries: Secondary | ICD-10-CM | POA: Diagnosis not present

## 2016-09-23 ENCOUNTER — Encounter: Payer: Self-pay | Admitting: Vascular Surgery

## 2016-09-24 ENCOUNTER — Ambulatory Visit: Payer: BLUE CROSS/BLUE SHIELD | Admitting: *Deleted

## 2016-09-24 DIAGNOSIS — I781 Nevus, non-neoplastic: Secondary | ICD-10-CM

## 2016-09-24 NOTE — Progress Notes (Signed)
Pt came in for a sclero check to see what she needed. Appt made for Nov. 7.

## 2016-10-24 DIAGNOSIS — M6740 Ganglion, unspecified site: Secondary | ICD-10-CM | POA: Diagnosis not present

## 2016-10-31 DIAGNOSIS — D519 Vitamin B12 deficiency anemia, unspecified: Secondary | ICD-10-CM | POA: Diagnosis not present

## 2016-10-31 DIAGNOSIS — E559 Vitamin D deficiency, unspecified: Secondary | ICD-10-CM | POA: Diagnosis not present

## 2016-10-31 DIAGNOSIS — N951 Menopausal and female climacteric states: Secondary | ICD-10-CM | POA: Diagnosis not present

## 2016-11-07 ENCOUNTER — Ambulatory Visit (INDEPENDENT_AMBULATORY_CARE_PROVIDER_SITE_OTHER): Payer: BLUE CROSS/BLUE SHIELD | Admitting: Sports Medicine

## 2016-11-07 DIAGNOSIS — M21611 Bunion of right foot: Secondary | ICD-10-CM | POA: Diagnosis not present

## 2016-11-07 DIAGNOSIS — S93492A Sprain of other ligament of left ankle, initial encounter: Secondary | ICD-10-CM

## 2016-11-08 DIAGNOSIS — S93409A Sprain of unspecified ligament of unspecified ankle, initial encounter: Secondary | ICD-10-CM | POA: Insufficient documentation

## 2016-11-08 NOTE — Progress Notes (Signed)
CC; Left ankle and RT foot pain  Patient with ankle sprain 1 month ago Has seen Dr Toney Reil, [place] who has provided appropriate care She has gotten rid of swelling Has wrap on ankle support that helps Wants to know if ankle is stable to start tennis  Some PF area pain on RT This started at about same time of injury Not severe and not consistent in mornings Using a custom orthotic from [place] [employer] some stretches  ROS No swelling in ankle now Feels that balance weaker on left  PE Thin F in NAD BP 136/82   Ht 5\' 8"  (1.727 m)   Wt 140 lb (63.5 kg)   BMI 21.29 kg/m   RT foot Mild TTP at insertion of PF medially Has some early valgus deviation of MTP 1 2nd toe displaced by valgus shift of toe 1 Loss of > 50% motin of flex/ext of great toe  Left ankle  No visible erythema or swelling. Range of motion is full in all directions. Strength is 5/5 in all directions. Stable lateral and medial ligaments; squeeze test and kleiger test unremarkable; She still has mild increase in anterior laxity vs RT Talar dome nontender; No pain at base of 5th MT; No tenderness over cuboid; No tenderness over N spot or navicular prominence No tenderness on posterior aspects of lateral and medial malleolus No sign of peroneal tendon subluxations; Negative tarsal tunnel tinel's Able to walk 4 steps.  1 foot balance is pretty good on left but not as good as right

## 2016-11-08 NOTE — Assessment & Plan Note (Signed)
She is making good progress Has had conservative care  Keep using wrap on ankle support Work 1 foot balance daily Start 1 foot drills for tennis  Reck if problems

## 2016-11-08 NOTE — Assessment & Plan Note (Signed)
This is developing stiffness and causing PF strain  Start HEP with calf exercises PF stretches Heat and ROM for great toe Use orthotics for sport

## 2016-12-09 ENCOUNTER — Other Ambulatory Visit: Payer: Self-pay | Admitting: Obstetrics and Gynecology

## 2016-12-09 DIAGNOSIS — Z1231 Encounter for screening mammogram for malignant neoplasm of breast: Secondary | ICD-10-CM

## 2017-01-01 ENCOUNTER — Ambulatory Visit
Admission: RE | Admit: 2017-01-01 | Discharge: 2017-01-01 | Disposition: A | Discharge status: Home / Self Care | Payer: BLUE CROSS/BLUE SHIELD | Source: Ambulatory Visit | Attending: Obstetrics and Gynecology | Admitting: Obstetrics and Gynecology

## 2017-01-01 DIAGNOSIS — Z1231 Encounter for screening mammogram for malignant neoplasm of breast: Secondary | ICD-10-CM | POA: Diagnosis not present

## 2017-01-08 ENCOUNTER — Ambulatory Visit: Payer: BLUE CROSS/BLUE SHIELD | Admitting: *Deleted

## 2017-01-21 DIAGNOSIS — E039 Hypothyroidism, unspecified: Secondary | ICD-10-CM | POA: Diagnosis not present

## 2017-01-30 DIAGNOSIS — H2513 Age-related nuclear cataract, bilateral: Secondary | ICD-10-CM | POA: Diagnosis not present

## 2017-01-30 DIAGNOSIS — H43813 Vitreous degeneration, bilateral: Secondary | ICD-10-CM | POA: Diagnosis not present

## 2017-01-30 DIAGNOSIS — H5203 Hypermetropia, bilateral: Secondary | ICD-10-CM | POA: Diagnosis not present

## 2017-01-30 DIAGNOSIS — H04123 Dry eye syndrome of bilateral lacrimal glands: Secondary | ICD-10-CM | POA: Diagnosis not present

## 2017-03-10 DIAGNOSIS — Z124 Encounter for screening for malignant neoplasm of cervix: Secondary | ICD-10-CM | POA: Diagnosis not present

## 2017-03-10 DIAGNOSIS — Z6820 Body mass index (BMI) 20.0-20.9, adult: Secondary | ICD-10-CM | POA: Diagnosis not present

## 2017-03-10 DIAGNOSIS — Z1389 Encounter for screening for other disorder: Secondary | ICD-10-CM | POA: Diagnosis not present

## 2017-03-10 DIAGNOSIS — Z130 Encounter for screening for diseases of the blood and blood-forming organs and certain disorders involving the immune mechanism: Secondary | ICD-10-CM | POA: Diagnosis not present

## 2017-03-10 DIAGNOSIS — Z01419 Encounter for gynecological examination (general) (routine) without abnormal findings: Secondary | ICD-10-CM | POA: Diagnosis not present

## 2017-03-26 ENCOUNTER — Ambulatory Visit: Payer: BLUE CROSS/BLUE SHIELD | Admitting: *Deleted

## 2017-03-26 DIAGNOSIS — E038 Other specified hypothyroidism: Secondary | ICD-10-CM | POA: Diagnosis not present

## 2017-03-27 DIAGNOSIS — Z13820 Encounter for screening for osteoporosis: Secondary | ICD-10-CM | POA: Diagnosis not present

## 2017-04-02 DIAGNOSIS — Z0000 Encounter for general adult medical examination without abnormal findings: Secondary | ICD-10-CM | POA: Diagnosis not present

## 2017-04-02 DIAGNOSIS — J329 Chronic sinusitis, unspecified: Secondary | ICD-10-CM | POA: Diagnosis not present

## 2017-04-02 DIAGNOSIS — E041 Nontoxic single thyroid nodule: Secondary | ICD-10-CM | POA: Diagnosis not present

## 2017-04-02 DIAGNOSIS — F325 Major depressive disorder, single episode, in full remission: Secondary | ICD-10-CM | POA: Diagnosis not present

## 2017-04-02 DIAGNOSIS — E038 Other specified hypothyroidism: Secondary | ICD-10-CM | POA: Diagnosis not present

## 2017-04-02 DIAGNOSIS — Z1389 Encounter for screening for other disorder: Secondary | ICD-10-CM | POA: Diagnosis not present

## 2017-04-03 ENCOUNTER — Other Ambulatory Visit: Payer: Self-pay | Admitting: Internal Medicine

## 2017-04-03 DIAGNOSIS — E041 Nontoxic single thyroid nodule: Secondary | ICD-10-CM

## 2017-04-07 DIAGNOSIS — Z1212 Encounter for screening for malignant neoplasm of rectum: Secondary | ICD-10-CM | POA: Diagnosis not present

## 2017-04-08 DIAGNOSIS — L570 Actinic keratosis: Secondary | ICD-10-CM | POA: Diagnosis not present

## 2017-04-08 DIAGNOSIS — L820 Inflamed seborrheic keratosis: Secondary | ICD-10-CM | POA: Diagnosis not present

## 2017-04-08 DIAGNOSIS — Z85828 Personal history of other malignant neoplasm of skin: Secondary | ICD-10-CM | POA: Diagnosis not present

## 2017-04-14 ENCOUNTER — Ambulatory Visit
Admission: RE | Admit: 2017-04-14 | Discharge: 2017-04-14 | Disposition: A | Discharge status: Home / Self Care | Payer: BLUE CROSS/BLUE SHIELD | Source: Ambulatory Visit | Attending: Internal Medicine | Admitting: Internal Medicine

## 2017-04-14 DIAGNOSIS — E041 Nontoxic single thyroid nodule: Secondary | ICD-10-CM

## 2017-05-19 DIAGNOSIS — I349 Nonrheumatic mitral valve disorder, unspecified: Secondary | ICD-10-CM | POA: Diagnosis not present

## 2017-05-26 DIAGNOSIS — E039 Hypothyroidism, unspecified: Secondary | ICD-10-CM | POA: Diagnosis not present

## 2017-05-27 DIAGNOSIS — Z85828 Personal history of other malignant neoplasm of skin: Secondary | ICD-10-CM | POA: Diagnosis not present

## 2017-05-27 DIAGNOSIS — L821 Other seborrheic keratosis: Secondary | ICD-10-CM | POA: Diagnosis not present

## 2017-05-27 DIAGNOSIS — L820 Inflamed seborrheic keratosis: Secondary | ICD-10-CM | POA: Diagnosis not present

## 2017-05-27 DIAGNOSIS — L812 Freckles: Secondary | ICD-10-CM | POA: Diagnosis not present

## 2017-06-27 DIAGNOSIS — E039 Hypothyroidism, unspecified: Secondary | ICD-10-CM | POA: Diagnosis not present

## 2017-08-19 DIAGNOSIS — L570 Actinic keratosis: Secondary | ICD-10-CM | POA: Diagnosis not present

## 2017-08-19 DIAGNOSIS — L821 Other seborrheic keratosis: Secondary | ICD-10-CM | POA: Diagnosis not present

## 2017-08-19 DIAGNOSIS — Z85828 Personal history of other malignant neoplasm of skin: Secondary | ICD-10-CM | POA: Diagnosis not present

## 2017-08-22 DIAGNOSIS — E039 Hypothyroidism, unspecified: Secondary | ICD-10-CM | POA: Diagnosis not present

## 2017-08-27 DIAGNOSIS — E039 Hypothyroidism, unspecified: Secondary | ICD-10-CM | POA: Diagnosis not present

## 2017-08-27 DIAGNOSIS — N951 Menopausal and female climacteric states: Secondary | ICD-10-CM | POA: Diagnosis not present

## 2017-08-27 DIAGNOSIS — L659 Nonscarring hair loss, unspecified: Secondary | ICD-10-CM | POA: Diagnosis not present

## 2017-08-27 DIAGNOSIS — R51 Headache: Secondary | ICD-10-CM | POA: Diagnosis not present

## 2017-09-10 ENCOUNTER — Ambulatory Visit (INDEPENDENT_AMBULATORY_CARE_PROVIDER_SITE_OTHER): Payer: Self-pay | Admitting: *Deleted

## 2017-09-10 ENCOUNTER — Encounter: Payer: Self-pay | Admitting: *Deleted

## 2017-09-10 DIAGNOSIS — I8393 Asymptomatic varicose veins of bilateral lower extremities: Secondary | ICD-10-CM

## 2017-09-10 NOTE — Progress Notes (Signed)
X=.3% Sotradecol administered with a 27g butterfly.  Patient received a total of 6cc. Patient presents with small spider veins scattered over her legs. Easy access. Tol well. Anticipate good results. Used a combo of sclero and Cl. Treated her nose too.                 Cutaneous Laser:pulsed mode  810j/cm2 400 ms delay  13 ms Duration 0.5 spot  Total pulses: 499 Total energy 787.5  Total time::06  Photos: Yes.    Compression stockings applied: Yes.

## 2017-09-17 ENCOUNTER — Ambulatory Visit: Payer: BLUE CROSS/BLUE SHIELD | Admitting: *Deleted

## 2017-11-28 ENCOUNTER — Other Ambulatory Visit: Payer: Self-pay | Admitting: Obstetrics and Gynecology

## 2017-11-28 DIAGNOSIS — Z1231 Encounter for screening mammogram for malignant neoplasm of breast: Secondary | ICD-10-CM

## 2017-12-02 DIAGNOSIS — E039 Hypothyroidism, unspecified: Secondary | ICD-10-CM | POA: Diagnosis not present

## 2017-12-22 DIAGNOSIS — E039 Hypothyroidism, unspecified: Secondary | ICD-10-CM | POA: Diagnosis not present

## 2017-12-22 DIAGNOSIS — J329 Chronic sinusitis, unspecified: Secondary | ICD-10-CM | POA: Diagnosis not present

## 2017-12-22 DIAGNOSIS — E2749 Other adrenocortical insufficiency: Secondary | ICD-10-CM | POA: Diagnosis not present

## 2017-12-22 DIAGNOSIS — N951 Menopausal and female climacteric states: Secondary | ICD-10-CM | POA: Diagnosis not present

## 2017-12-29 DIAGNOSIS — R5383 Other fatigue: Secondary | ICD-10-CM | POA: Diagnosis not present

## 2017-12-29 DIAGNOSIS — E2749 Other adrenocortical insufficiency: Secondary | ICD-10-CM | POA: Diagnosis not present

## 2017-12-30 ENCOUNTER — Ambulatory Visit (INDEPENDENT_AMBULATORY_CARE_PROVIDER_SITE_OTHER): Payer: Self-pay | Admitting: *Deleted

## 2017-12-30 DIAGNOSIS — I8393 Asymptomatic varicose veins of bilateral lower extremities: Secondary | ICD-10-CM

## 2017-12-30 NOTE — Progress Notes (Signed)
   Cutaneous Laser:pulsed mode  810j/cm2 400 ms delay  13 ms Duration 0.5 spot  Total pulses: 2152 Total energy 3.409  Total time::09  Cleaned up her remaining small spiders and capillaries on both legs. Good local reaction. Hopefully she will be pleased with the results. Follow prn.

## 2018-01-05 ENCOUNTER — Ambulatory Visit
Admission: RE | Admit: 2018-01-05 | Discharge: 2018-01-05 | Disposition: A | Discharge status: Home / Self Care | Payer: BLUE CROSS/BLUE SHIELD | Source: Ambulatory Visit | Attending: Obstetrics and Gynecology | Admitting: Obstetrics and Gynecology

## 2018-01-05 DIAGNOSIS — Z1231 Encounter for screening mammogram for malignant neoplasm of breast: Secondary | ICD-10-CM | POA: Diagnosis not present

## 2018-03-16 DIAGNOSIS — L821 Other seborrheic keratosis: Secondary | ICD-10-CM | POA: Diagnosis not present

## 2018-03-16 DIAGNOSIS — L570 Actinic keratosis: Secondary | ICD-10-CM | POA: Diagnosis not present

## 2018-03-16 DIAGNOSIS — Z124 Encounter for screening for malignant neoplasm of cervix: Secondary | ICD-10-CM | POA: Diagnosis not present

## 2018-03-16 DIAGNOSIS — Z1389 Encounter for screening for other disorder: Secondary | ICD-10-CM | POA: Diagnosis not present

## 2018-03-16 DIAGNOSIS — Z01419 Encounter for gynecological examination (general) (routine) without abnormal findings: Secondary | ICD-10-CM | POA: Diagnosis not present

## 2018-03-16 DIAGNOSIS — Z130 Encounter for screening for diseases of the blood and blood-forming organs and certain disorders involving the immune mechanism: Secondary | ICD-10-CM | POA: Diagnosis not present

## 2018-03-16 DIAGNOSIS — Z1151 Encounter for screening for human papillomavirus (HPV): Secondary | ICD-10-CM | POA: Diagnosis not present

## 2018-03-16 DIAGNOSIS — L718 Other rosacea: Secondary | ICD-10-CM | POA: Diagnosis not present

## 2018-03-30 DIAGNOSIS — E041 Nontoxic single thyroid nodule: Secondary | ICD-10-CM | POA: Diagnosis not present

## 2018-03-30 DIAGNOSIS — E038 Other specified hypothyroidism: Secondary | ICD-10-CM | POA: Diagnosis not present

## 2018-03-30 DIAGNOSIS — Z0000 Encounter for general adult medical examination without abnormal findings: Secondary | ICD-10-CM | POA: Diagnosis not present

## 2018-04-06 DIAGNOSIS — E041 Nontoxic single thyroid nodule: Secondary | ICD-10-CM | POA: Diagnosis not present

## 2018-04-06 DIAGNOSIS — E038 Other specified hypothyroidism: Secondary | ICD-10-CM | POA: Diagnosis not present

## 2018-04-06 DIAGNOSIS — Z780 Asymptomatic menopausal state: Secondary | ICD-10-CM | POA: Diagnosis not present

## 2018-04-06 DIAGNOSIS — I340 Nonrheumatic mitral (valve) insufficiency: Secondary | ICD-10-CM | POA: Diagnosis not present

## 2018-04-06 DIAGNOSIS — Z1331 Encounter for screening for depression: Secondary | ICD-10-CM | POA: Diagnosis not present

## 2018-04-06 DIAGNOSIS — Z0000 Encounter for general adult medical examination without abnormal findings: Secondary | ICD-10-CM | POA: Diagnosis not present

## 2018-05-01 DIAGNOSIS — H0012 Chalazion right lower eyelid: Secondary | ICD-10-CM | POA: Diagnosis not present

## 2018-05-01 DIAGNOSIS — L570 Actinic keratosis: Secondary | ICD-10-CM | POA: Diagnosis not present

## 2018-05-06 DIAGNOSIS — H0011 Chalazion right upper eyelid: Secondary | ICD-10-CM | POA: Diagnosis not present

## 2018-05-06 DIAGNOSIS — H40051 Ocular hypertension, right eye: Secondary | ICD-10-CM | POA: Diagnosis not present

## 2018-05-06 DIAGNOSIS — H0012 Chalazion right lower eyelid: Secondary | ICD-10-CM | POA: Diagnosis not present

## 2018-05-27 DIAGNOSIS — H0012 Chalazion right lower eyelid: Secondary | ICD-10-CM | POA: Diagnosis not present

## 2018-05-27 DIAGNOSIS — Z01419 Encounter for gynecological examination (general) (routine) without abnormal findings: Secondary | ICD-10-CM | POA: Diagnosis not present

## 2018-05-27 DIAGNOSIS — H0011 Chalazion right upper eyelid: Secondary | ICD-10-CM | POA: Diagnosis not present

## 2018-07-23 DIAGNOSIS — E039 Hypothyroidism, unspecified: Secondary | ICD-10-CM | POA: Diagnosis not present

## 2018-07-29 DIAGNOSIS — N644 Mastodynia: Secondary | ICD-10-CM | POA: Diagnosis not present

## 2018-08-05 DIAGNOSIS — H25813 Combined forms of age-related cataract, bilateral: Secondary | ICD-10-CM | POA: Diagnosis not present

## 2018-08-05 DIAGNOSIS — H5203 Hypermetropia, bilateral: Secondary | ICD-10-CM | POA: Diagnosis not present

## 2018-08-05 DIAGNOSIS — H04123 Dry eye syndrome of bilateral lacrimal glands: Secondary | ICD-10-CM | POA: Diagnosis not present

## 2018-08-05 DIAGNOSIS — H43813 Vitreous degeneration, bilateral: Secondary | ICD-10-CM | POA: Diagnosis not present

## 2018-08-18 DIAGNOSIS — I788 Other diseases of capillaries: Secondary | ICD-10-CM | POA: Diagnosis not present

## 2018-08-18 DIAGNOSIS — D692 Other nonthrombocytopenic purpura: Secondary | ICD-10-CM | POA: Diagnosis not present

## 2018-08-18 DIAGNOSIS — L821 Other seborrheic keratosis: Secondary | ICD-10-CM | POA: Diagnosis not present

## 2018-08-18 DIAGNOSIS — D0462 Carcinoma in situ of skin of left upper limb, including shoulder: Secondary | ICD-10-CM | POA: Diagnosis not present

## 2018-08-18 DIAGNOSIS — Z85828 Personal history of other malignant neoplasm of skin: Secondary | ICD-10-CM | POA: Diagnosis not present

## 2018-11-19 DIAGNOSIS — H0014 Chalazion left upper eyelid: Secondary | ICD-10-CM | POA: Diagnosis not present

## 2018-12-09 ENCOUNTER — Other Ambulatory Visit: Payer: Self-pay | Admitting: Obstetrics and Gynecology

## 2018-12-09 DIAGNOSIS — Z1231 Encounter for screening mammogram for malignant neoplasm of breast: Secondary | ICD-10-CM

## 2018-12-28 DIAGNOSIS — M6740 Ganglion, unspecified site: Secondary | ICD-10-CM | POA: Diagnosis not present

## 2018-12-28 DIAGNOSIS — M25512 Pain in left shoulder: Secondary | ICD-10-CM | POA: Diagnosis not present

## 2018-12-28 DIAGNOSIS — S76311A Strain of muscle, fascia and tendon of the posterior muscle group at thigh level, right thigh, initial encounter: Secondary | ICD-10-CM | POA: Diagnosis not present

## 2018-12-28 DIAGNOSIS — M7712 Lateral epicondylitis, left elbow: Secondary | ICD-10-CM | POA: Diagnosis not present

## 2018-12-29 DIAGNOSIS — N951 Menopausal and female climacteric states: Secondary | ICD-10-CM | POA: Diagnosis not present

## 2018-12-29 DIAGNOSIS — F329 Major depressive disorder, single episode, unspecified: Secondary | ICD-10-CM | POA: Diagnosis not present

## 2018-12-29 DIAGNOSIS — E039 Hypothyroidism, unspecified: Secondary | ICD-10-CM | POA: Diagnosis not present

## 2018-12-29 DIAGNOSIS — E538 Deficiency of other specified B group vitamins: Secondary | ICD-10-CM | POA: Diagnosis not present

## 2018-12-29 DIAGNOSIS — E559 Vitamin D deficiency, unspecified: Secondary | ICD-10-CM | POA: Diagnosis not present

## 2018-12-29 DIAGNOSIS — E7212 Methylenetetrahydrofolate reductase deficiency: Secondary | ICD-10-CM | POA: Diagnosis not present

## 2018-12-29 DIAGNOSIS — E7210 Disorders of sulfur-bearing amino-acid metabolism, unspecified: Secondary | ICD-10-CM | POA: Diagnosis not present

## 2019-01-03 HISTORY — PX: BREAST BIOPSY: SHX20

## 2019-01-06 DIAGNOSIS — M25812 Other specified joint disorders, left shoulder: Secondary | ICD-10-CM | POA: Diagnosis not present

## 2019-01-14 ENCOUNTER — Ambulatory Visit
Admission: RE | Admit: 2019-01-14 | Discharge: 2019-01-14 | Disposition: A | Discharge status: Home / Self Care | Payer: BC Managed Care – PPO | Source: Ambulatory Visit | Attending: Obstetrics and Gynecology | Admitting: Obstetrics and Gynecology

## 2019-01-14 ENCOUNTER — Other Ambulatory Visit: Payer: Self-pay

## 2019-01-14 DIAGNOSIS — Z1231 Encounter for screening mammogram for malignant neoplasm of breast: Secondary | ICD-10-CM

## 2019-01-18 ENCOUNTER — Other Ambulatory Visit: Payer: Self-pay | Admitting: Obstetrics and Gynecology

## 2019-01-18 DIAGNOSIS — R928 Other abnormal and inconclusive findings on diagnostic imaging of breast: Secondary | ICD-10-CM

## 2019-01-18 DIAGNOSIS — E039 Hypothyroidism, unspecified: Secondary | ICD-10-CM | POA: Diagnosis not present

## 2019-01-18 DIAGNOSIS — E559 Vitamin D deficiency, unspecified: Secondary | ICD-10-CM | POA: Diagnosis not present

## 2019-01-18 DIAGNOSIS — E538 Deficiency of other specified B group vitamins: Secondary | ICD-10-CM | POA: Diagnosis not present

## 2019-01-18 DIAGNOSIS — R0602 Shortness of breath: Secondary | ICD-10-CM | POA: Diagnosis not present

## 2019-01-20 ENCOUNTER — Ambulatory Visit
Admission: RE | Admit: 2019-01-20 | Discharge: 2019-01-20 | Disposition: A | Discharge status: Home / Self Care | Payer: BC Managed Care – PPO | Source: Ambulatory Visit | Attending: Obstetrics and Gynecology | Admitting: Obstetrics and Gynecology

## 2019-01-20 ENCOUNTER — Other Ambulatory Visit: Payer: Self-pay | Admitting: Obstetrics and Gynecology

## 2019-01-20 ENCOUNTER — Other Ambulatory Visit: Payer: Self-pay

## 2019-01-20 DIAGNOSIS — R928 Other abnormal and inconclusive findings on diagnostic imaging of breast: Secondary | ICD-10-CM

## 2019-01-20 DIAGNOSIS — R921 Mammographic calcification found on diagnostic imaging of breast: Secondary | ICD-10-CM

## 2019-01-21 ENCOUNTER — Ambulatory Visit
Admission: RE | Admit: 2019-01-21 | Discharge: 2019-01-21 | Disposition: A | Discharge status: Home / Self Care | Payer: BC Managed Care – PPO | Source: Ambulatory Visit | Attending: Obstetrics and Gynecology | Admitting: Obstetrics and Gynecology

## 2019-01-21 DIAGNOSIS — R921 Mammographic calcification found on diagnostic imaging of breast: Secondary | ICD-10-CM

## 2019-01-21 DIAGNOSIS — N6011 Diffuse cystic mastopathy of right breast: Secondary | ICD-10-CM | POA: Diagnosis not present

## 2019-01-27 DIAGNOSIS — R0600 Dyspnea, unspecified: Secondary | ICD-10-CM | POA: Diagnosis not present

## 2019-01-27 DIAGNOSIS — E039 Hypothyroidism, unspecified: Secondary | ICD-10-CM | POA: Diagnosis not present

## 2019-01-27 DIAGNOSIS — R030 Elevated blood-pressure reading, without diagnosis of hypertension: Secondary | ICD-10-CM | POA: Diagnosis not present

## 2019-01-27 DIAGNOSIS — F419 Anxiety disorder, unspecified: Secondary | ICD-10-CM | POA: Diagnosis not present

## 2019-02-04 DIAGNOSIS — L718 Other rosacea: Secondary | ICD-10-CM | POA: Diagnosis not present

## 2019-02-04 DIAGNOSIS — L812 Freckles: Secondary | ICD-10-CM | POA: Diagnosis not present

## 2019-02-04 DIAGNOSIS — L700 Acne vulgaris: Secondary | ICD-10-CM | POA: Diagnosis not present

## 2019-02-04 DIAGNOSIS — L820 Inflamed seborrheic keratosis: Secondary | ICD-10-CM | POA: Diagnosis not present

## 2019-02-04 DIAGNOSIS — Z85828 Personal history of other malignant neoplasm of skin: Secondary | ICD-10-CM | POA: Diagnosis not present

## 2019-02-04 DIAGNOSIS — L821 Other seborrheic keratosis: Secondary | ICD-10-CM | POA: Diagnosis not present

## 2019-02-04 DIAGNOSIS — L570 Actinic keratosis: Secondary | ICD-10-CM | POA: Diagnosis not present

## 2019-02-19 ENCOUNTER — Other Ambulatory Visit (HOSPITAL_COMMUNITY): Payer: Self-pay | Admitting: Respiratory Therapy

## 2019-02-19 DIAGNOSIS — R0609 Other forms of dyspnea: Secondary | ICD-10-CM

## 2019-02-19 DIAGNOSIS — R0600 Dyspnea, unspecified: Secondary | ICD-10-CM

## 2019-02-23 ENCOUNTER — Ambulatory Visit: Payer: BC Managed Care – PPO | Attending: Internal Medicine

## 2019-02-23 DIAGNOSIS — Z20828 Contact with and (suspected) exposure to other viral communicable diseases: Secondary | ICD-10-CM | POA: Diagnosis not present

## 2019-02-23 DIAGNOSIS — Z20822 Contact with and (suspected) exposure to covid-19: Secondary | ICD-10-CM

## 2019-02-24 LAB — NOVEL CORONAVIRUS, NAA: SARS-CoV-2, NAA: NOT DETECTED

## 2019-03-01 ENCOUNTER — Ambulatory Visit (HOSPITAL_COMMUNITY)
Admission: RE | Admit: 2019-03-01 | Discharge: 2019-03-01 | Disposition: A | Discharge status: Home / Self Care | Payer: BLUE CROSS/BLUE SHIELD | Source: Ambulatory Visit | Attending: Internal Medicine | Admitting: Internal Medicine

## 2019-03-01 ENCOUNTER — Other Ambulatory Visit: Payer: Self-pay

## 2019-03-01 ENCOUNTER — Inpatient Hospital Stay (HOSPITAL_COMMUNITY): Admission: RE | Admit: 2019-03-01 | Payer: BC Managed Care – PPO | Source: Ambulatory Visit

## 2019-03-01 DIAGNOSIS — R0600 Dyspnea, unspecified: Secondary | ICD-10-CM | POA: Insufficient documentation

## 2019-03-01 LAB — PULMONARY FUNCTION TEST
DL/VA % pred: 88 %
DL/VA: 3.56 ml/min/mmHg/L
DLCO unc % pred: 92 %
DLCO unc: 20.68 ml/min/mmHg
FEF 25-75 Pre: 2.51 L/sec
FEF2575-%Pred-Pre: 112 %
FEV1-%Pred-Pre: 106 %
FEV1-Pre: 2.9 L
FEV1FVC-%Pred-Pre: 99 %
FEV6-%Pred-Pre: 108 %
FEV6-Pre: 3.72 L
FEV6FVC-%Pred-Pre: 103 %
FVC-%Pred-Pre: 107 %
FVC-Pre: 3.82 L
Pre FEV1/FVC ratio: 76 %
Pre FEV6/FVC Ratio: 99 %
RV % pred: 118 %
RV: 2.78 L
TLC % pred: 118 %
TLC: 6.71 L

## 2019-03-17 DIAGNOSIS — E039 Hypothyroidism, unspecified: Secondary | ICD-10-CM | POA: Diagnosis not present

## 2019-03-17 DIAGNOSIS — E042 Nontoxic multinodular goiter: Secondary | ICD-10-CM | POA: Diagnosis not present

## 2019-04-05 DIAGNOSIS — E559 Vitamin D deficiency, unspecified: Secondary | ICD-10-CM | POA: Diagnosis not present

## 2019-04-05 DIAGNOSIS — E039 Hypothyroidism, unspecified: Secondary | ICD-10-CM | POA: Diagnosis not present

## 2019-04-05 DIAGNOSIS — Z0000 Encounter for general adult medical examination without abnormal findings: Secondary | ICD-10-CM | POA: Diagnosis not present

## 2019-04-07 DIAGNOSIS — R82998 Other abnormal findings in urine: Secondary | ICD-10-CM | POA: Diagnosis not present

## 2019-04-08 DIAGNOSIS — Z1212 Encounter for screening for malignant neoplasm of rectum: Secondary | ICD-10-CM | POA: Diagnosis not present

## 2019-04-12 DIAGNOSIS — Z130 Encounter for screening for diseases of the blood and blood-forming organs and certain disorders involving the immune mechanism: Secondary | ICD-10-CM | POA: Diagnosis not present

## 2019-04-12 DIAGNOSIS — N841 Polyp of cervix uteri: Secondary | ICD-10-CM | POA: Diagnosis not present

## 2019-04-12 DIAGNOSIS — Z01419 Encounter for gynecological examination (general) (routine) without abnormal findings: Secondary | ICD-10-CM | POA: Diagnosis not present

## 2019-04-12 DIAGNOSIS — E669 Obesity, unspecified: Secondary | ICD-10-CM | POA: Diagnosis not present

## 2019-04-12 DIAGNOSIS — Z124 Encounter for screening for malignant neoplasm of cervix: Secondary | ICD-10-CM | POA: Diagnosis not present

## 2019-04-12 DIAGNOSIS — Z1389 Encounter for screening for other disorder: Secondary | ICD-10-CM | POA: Diagnosis not present

## 2019-04-13 DIAGNOSIS — I340 Nonrheumatic mitral (valve) insufficiency: Secondary | ICD-10-CM | POA: Diagnosis not present

## 2019-04-13 DIAGNOSIS — Z0000 Encounter for general adult medical examination without abnormal findings: Secondary | ICD-10-CM | POA: Diagnosis not present

## 2019-04-13 DIAGNOSIS — R030 Elevated blood-pressure reading, without diagnosis of hypertension: Secondary | ICD-10-CM | POA: Diagnosis not present

## 2019-04-13 DIAGNOSIS — E559 Vitamin D deficiency, unspecified: Secondary | ICD-10-CM | POA: Diagnosis not present

## 2019-04-13 DIAGNOSIS — E039 Hypothyroidism, unspecified: Secondary | ICD-10-CM | POA: Diagnosis not present

## 2019-04-13 DIAGNOSIS — Z1331 Encounter for screening for depression: Secondary | ICD-10-CM | POA: Diagnosis not present

## 2019-04-19 DIAGNOSIS — F3342 Major depressive disorder, recurrent, in full remission: Secondary | ICD-10-CM | POA: Diagnosis not present

## 2019-06-08 DIAGNOSIS — E039 Hypothyroidism, unspecified: Secondary | ICD-10-CM | POA: Diagnosis not present

## 2019-06-09 DIAGNOSIS — N390 Urinary tract infection, site not specified: Secondary | ICD-10-CM | POA: Diagnosis not present

## 2019-06-09 DIAGNOSIS — R300 Dysuria: Secondary | ICD-10-CM | POA: Diagnosis not present

## 2019-07-06 DIAGNOSIS — N390 Urinary tract infection, site not specified: Secondary | ICD-10-CM | POA: Diagnosis not present

## 2019-07-06 DIAGNOSIS — R82998 Other abnormal findings in urine: Secondary | ICD-10-CM | POA: Diagnosis not present

## 2019-07-06 DIAGNOSIS — R300 Dysuria: Secondary | ICD-10-CM | POA: Diagnosis not present

## 2019-07-15 DIAGNOSIS — R350 Frequency of micturition: Secondary | ICD-10-CM | POA: Diagnosis not present

## 2019-07-15 DIAGNOSIS — L708 Other acne: Secondary | ICD-10-CM | POA: Diagnosis not present

## 2019-07-15 DIAGNOSIS — E039 Hypothyroidism, unspecified: Secondary | ICD-10-CM | POA: Diagnosis not present

## 2019-07-15 DIAGNOSIS — N390 Urinary tract infection, site not specified: Secondary | ICD-10-CM | POA: Diagnosis not present

## 2019-07-15 DIAGNOSIS — F329 Major depressive disorder, single episode, unspecified: Secondary | ICD-10-CM | POA: Diagnosis not present

## 2019-08-19 DIAGNOSIS — H43813 Vitreous degeneration, bilateral: Secondary | ICD-10-CM | POA: Diagnosis not present

## 2019-08-19 DIAGNOSIS — N390 Urinary tract infection, site not specified: Secondary | ICD-10-CM | POA: Diagnosis not present

## 2019-08-19 DIAGNOSIS — H5203 Hypermetropia, bilateral: Secondary | ICD-10-CM | POA: Diagnosis not present

## 2019-08-19 DIAGNOSIS — H25813 Combined forms of age-related cataract, bilateral: Secondary | ICD-10-CM | POA: Diagnosis not present

## 2019-08-19 DIAGNOSIS — H0100A Unspecified blepharitis right eye, upper and lower eyelids: Secondary | ICD-10-CM | POA: Diagnosis not present

## 2019-08-19 DIAGNOSIS — R350 Frequency of micturition: Secondary | ICD-10-CM | POA: Diagnosis not present

## 2019-08-27 DIAGNOSIS — R350 Frequency of micturition: Secondary | ICD-10-CM | POA: Diagnosis not present

## 2019-08-27 DIAGNOSIS — N3000 Acute cystitis without hematuria: Secondary | ICD-10-CM | POA: Diagnosis not present

## 2019-09-07 DIAGNOSIS — Z85828 Personal history of other malignant neoplasm of skin: Secondary | ICD-10-CM | POA: Diagnosis not present

## 2019-09-07 DIAGNOSIS — L821 Other seborrheic keratosis: Secondary | ICD-10-CM | POA: Diagnosis not present

## 2019-09-07 DIAGNOSIS — I788 Other diseases of capillaries: Secondary | ICD-10-CM | POA: Diagnosis not present

## 2019-09-07 DIAGNOSIS — L718 Other rosacea: Secondary | ICD-10-CM | POA: Diagnosis not present

## 2019-09-07 DIAGNOSIS — L570 Actinic keratosis: Secondary | ICD-10-CM | POA: Diagnosis not present

## 2019-09-08 DIAGNOSIS — R140 Abdominal distension (gaseous): Secondary | ICD-10-CM | POA: Diagnosis not present

## 2019-09-08 DIAGNOSIS — K5909 Other constipation: Secondary | ICD-10-CM | POA: Diagnosis not present

## 2019-09-08 DIAGNOSIS — R350 Frequency of micturition: Secondary | ICD-10-CM | POA: Diagnosis not present

## 2019-09-10 DIAGNOSIS — E042 Nontoxic multinodular goiter: Secondary | ICD-10-CM | POA: Diagnosis not present

## 2019-09-10 DIAGNOSIS — E039 Hypothyroidism, unspecified: Secondary | ICD-10-CM | POA: Diagnosis not present

## 2019-09-10 DIAGNOSIS — R140 Abdominal distension (gaseous): Secondary | ICD-10-CM | POA: Diagnosis not present

## 2019-09-14 DIAGNOSIS — E039 Hypothyroidism, unspecified: Secondary | ICD-10-CM | POA: Diagnosis not present

## 2019-09-14 DIAGNOSIS — R140 Abdominal distension (gaseous): Secondary | ICD-10-CM | POA: Diagnosis not present

## 2019-09-14 DIAGNOSIS — E042 Nontoxic multinodular goiter: Secondary | ICD-10-CM | POA: Diagnosis not present

## 2019-09-16 ENCOUNTER — Other Ambulatory Visit: Payer: Self-pay | Admitting: Internal Medicine

## 2019-09-16 DIAGNOSIS — R109 Unspecified abdominal pain: Secondary | ICD-10-CM

## 2019-09-24 DIAGNOSIS — R140 Abdominal distension (gaseous): Secondary | ICD-10-CM | POA: Diagnosis not present

## 2019-09-24 DIAGNOSIS — K5900 Constipation, unspecified: Secondary | ICD-10-CM | POA: Diagnosis not present

## 2019-09-24 DIAGNOSIS — R109 Unspecified abdominal pain: Secondary | ICD-10-CM | POA: Diagnosis not present

## 2019-10-01 ENCOUNTER — Other Ambulatory Visit: Payer: Self-pay

## 2019-10-01 ENCOUNTER — Ambulatory Visit
Admission: RE | Admit: 2019-10-01 | Discharge: 2019-10-01 | Disposition: A | Discharge status: Home / Self Care | Payer: BLUE CROSS/BLUE SHIELD | Source: Ambulatory Visit | Attending: Internal Medicine | Admitting: Internal Medicine

## 2019-10-01 DIAGNOSIS — R140 Abdominal distension (gaseous): Secondary | ICD-10-CM | POA: Diagnosis not present

## 2019-10-01 DIAGNOSIS — R109 Unspecified abdominal pain: Secondary | ICD-10-CM

## 2019-10-01 DIAGNOSIS — K7689 Other specified diseases of liver: Secondary | ICD-10-CM | POA: Diagnosis not present

## 2019-10-01 MED ORDER — IOPAMIDOL (ISOVUE-300) INJECTION 61%
100.0000 mL | Freq: Once | INTRAVENOUS | Status: AC | PRN
  Administered 2019-10-01: 100 mL via INTRAVENOUS

## 2019-11-01 DIAGNOSIS — E039 Hypothyroidism, unspecified: Secondary | ICD-10-CM | POA: Diagnosis not present

## 2019-11-09 DIAGNOSIS — J069 Acute upper respiratory infection, unspecified: Secondary | ICD-10-CM | POA: Diagnosis not present

## 2019-11-10 ENCOUNTER — Encounter: Payer: Self-pay | Admitting: Hospice and Palliative Medicine

## 2019-11-10 ENCOUNTER — Other Ambulatory Visit (HOSPITAL_COMMUNITY): Payer: Self-pay | Admitting: Nurse Practitioner

## 2019-11-10 ENCOUNTER — Telehealth: Payer: Self-pay | Admitting: Hospice and Palliative Medicine

## 2019-11-10 ENCOUNTER — Encounter: Payer: Self-pay | Admitting: Nurse Practitioner

## 2019-11-10 DIAGNOSIS — U071 COVID-19: Secondary | ICD-10-CM

## 2019-11-10 NOTE — Progress Notes (Signed)
I connected by phone with Crystal Galvan on 11/10/2019 at 7:35 PM to discuss the potential use of an new treatment for mild to moderate COVID-19 viral infection in non-hospitalized patients.  This patient is a 69 y.o. female that meets the FDA criteria for Emergency Use Authorization of casirivimab\imdevimab.  Has a (+) direct SARS-CoV-2 viral test result- home test  Has mild or moderate COVID-19   Is ? 69 years of age and weighs ? 40 kg  Is NOT hospitalized due to COVID-19  Is NOT requiring oxygen therapy or requiring an increase in baseline oxygen flow rate due to COVID-19  Is within 10 days of symptom onset  Has at least one of the high risk factor(s) for progression to severe COVID-19 and/or hospitalization as defined in EUA.  Specific high risk criteria : Older age (>/= 69 yo)   Sx onset 11/07/19. Unvaccinated. Taking ivermectin, zinc, vit d, vit c per her PCP. Not interested in vaccine due to long term safety data concerns. She has reached out to her pcp to discuss MAB and is waiting to hear back.    I have spoken and communicated the following to the patient or parent/caregiver:  1. FDA has authorized the emergency use of casirivimab\imdevimab for the treatment of mild to moderate COVID-19 in adults and pediatric patients with positive results of direct SARS-CoV-2 viral testing who are 53 years of age and older weighing at least 40 kg, and who are at high risk for progressing to severe COVID-19 and/or hospitalization.  2. The significant known and potential risks and benefits of casirivimab\imdevimab, and the extent to which such potential risks and benefits are unknown.  3. Information on available alternative treatments and the risks and benefits of those alternatives, including clinical trials.  4. Patients treated with casirivimab\imdevimab should continue to self-isolate and use infection control measures (e.g., wear mask, isolate, social distance, avoid sharing personal items,  clean and disinfect "high touch" surfaces, and frequent handwashing) according to CDC guidelines.   5. The patient or parent/caregiver has the option to accept or refuse casirivimab\imdevimab .  After reviewing this information with the patient, The patient agreed to proceed with receiving casirivimab\imdevimab infusion and will be provided a copy of the Fact sheet prior to receiving the infusion.Beckey Rutter, [place], AGNP-C [phone] ([place])

## 2019-11-10 NOTE — Telephone Encounter (Signed)
I called to discuss with patient about Covid-19 symptoms and the use of regeneron, a monoclonal antibody infusion for those with mild to moderate Covid-19 symptoms and at a high risk of hospitalization.     Pt is qualified for this infusion at the [facility] due to co-morbid conditions and/or a member of an at-risk group.     Unable to reach pt. Left message to return call. I also sent her a message in [place].   Altha Harm, PhD, NP-C [phone] ([place])

## 2019-11-12 ENCOUNTER — Ambulatory Visit (HOSPITAL_COMMUNITY)
Admission: RE | Admit: 2019-11-12 | Discharge: 2019-11-12 | Disposition: A | Discharge status: Home / Self Care | Payer: BLUE CROSS/BLUE SHIELD | Source: Ambulatory Visit | Attending: Pulmonary Disease | Admitting: Pulmonary Disease

## 2019-11-12 DIAGNOSIS — U071 COVID-19: Secondary | ICD-10-CM

## 2019-11-12 MED ORDER — METHYLPREDNISOLONE SODIUM SUCC 125 MG IJ SOLR: 125.0000 mg | Freq: Once | INTRAMUSCULAR | Status: DC | PRN

## 2019-11-12 MED ORDER — DIPHENHYDRAMINE HCL 50 MG/ML IJ SOLN: 50.0000 mg | Freq: Once | INTRAMUSCULAR | Status: DC | PRN

## 2019-11-12 MED ORDER — EPINEPHRINE 0.3 MG/0.3ML IJ SOAJ: 0.3000 mg | Freq: Once | INTRAMUSCULAR | Status: DC | PRN

## 2019-11-12 MED ORDER — SODIUM CHLORIDE 0.9 % IV SOLN: INTRAVENOUS | Status: DC | PRN

## 2019-11-12 MED ORDER — FAMOTIDINE IN NACL 20-0.9 MG/50ML-% IV SOLN: 20.0000 mg | Freq: Once | INTRAVENOUS | Status: DC | PRN

## 2019-11-12 MED ORDER — SODIUM CHLORIDE 0.9 % IV SOLN
1200.0000 mg | Freq: Once | INTRAVENOUS | Status: DC
  Filled 2019-11-12: qty 10

## 2019-11-12 MED ORDER — ALBUTEROL SULFATE HFA 108 (90 BASE) MCG/ACT IN AERS: 2.0000 | INHALATION_SPRAY | Freq: Once | RESPIRATORY_TRACT | Status: DC | PRN

## 2019-11-13 ENCOUNTER — Encounter (HOSPITAL_COMMUNITY): Payer: Self-pay

## 2019-11-13 ENCOUNTER — Inpatient Hospital Stay (HOSPITAL_COMMUNITY): Admission: RE | Admit: 2019-11-13 | Payer: BLUE CROSS/BLUE SHIELD | Source: Ambulatory Visit

## 2019-11-15 ENCOUNTER — Ambulatory Visit (HOSPITAL_COMMUNITY)
Admission: RE | Admit: 2019-11-15 | Discharge: 2019-11-15 | Disposition: A | Discharge status: Home / Self Care | Payer: BLUE CROSS/BLUE SHIELD | Source: Ambulatory Visit | Attending: Pulmonary Disease | Admitting: Pulmonary Disease

## 2019-11-15 VITALS — BP 133/69 | HR 70 | Temp 98.6°F | Resp 16

## 2019-11-15 DIAGNOSIS — R54 Age-related physical debility: Secondary | ICD-10-CM | POA: Diagnosis not present

## 2019-11-15 DIAGNOSIS — U071 COVID-19: Secondary | ICD-10-CM | POA: Insufficient documentation

## 2019-11-15 DIAGNOSIS — J449 Chronic obstructive pulmonary disease, unspecified: Secondary | ICD-10-CM | POA: Diagnosis not present

## 2019-11-15 MED ORDER — DIPHENHYDRAMINE HCL 50 MG/ML IJ SOLN: 50.0000 mg | Freq: Once | INTRAMUSCULAR | Status: DC | PRN

## 2019-11-15 MED ORDER — EPINEPHRINE 0.3 MG/0.3ML IJ SOAJ: 0.3000 mg | Freq: Once | INTRAMUSCULAR | Status: DC | PRN

## 2019-11-15 MED ORDER — SODIUM CHLORIDE 0.9 % IV SOLN: INTRAVENOUS | Status: DC | PRN

## 2019-11-15 MED ORDER — FAMOTIDINE IN NACL 20-0.9 MG/50ML-% IV SOLN: 20.0000 mg | Freq: Once | INTRAVENOUS | Status: DC | PRN

## 2019-11-15 MED ORDER — ALBUTEROL SULFATE HFA 108 (90 BASE) MCG/ACT IN AERS: 2.0000 | INHALATION_SPRAY | Freq: Once | RESPIRATORY_TRACT | Status: DC | PRN

## 2019-11-15 MED ORDER — METHYLPREDNISOLONE SODIUM SUCC 125 MG IJ SOLR: 125.0000 mg | Freq: Once | INTRAMUSCULAR | Status: DC | PRN

## 2019-11-15 MED ORDER — SODIUM CHLORIDE 0.9 % IV SOLN
1200.0000 mg | Freq: Once | INTRAVENOUS | Status: AC
  Administered 2019-11-15: 1200 mg via INTRAVENOUS
  Filled 2019-11-15: qty 10

## 2019-11-15 NOTE — Progress Notes (Addendum)
  Diagnosis: COVID-19  Physician: Dr. Joya Gaskins  Procedure: [facility] Infusion Clinic Med: casirivimab\imdevimab infusion - Provided patient with casirivimab\imdevimab fact sheet for patients, parents and caregivers prior to infusion.  Complications: No immediate complications noted.  Discharge: Discharged home. Asked pt if she had been vaccinated. She leaned forward in her seat, raised her voice, and said loudly, "No. Don't even ask me about getting that vaccine."    Tia Masker 11/15/2019

## 2019-11-15 NOTE — Discharge Instructions (Signed)

## 2019-11-25 DIAGNOSIS — H6692 Otitis media, unspecified, left ear: Secondary | ICD-10-CM | POA: Diagnosis not present

## 2019-12-01 DIAGNOSIS — Z20828 Contact with and (suspected) exposure to other viral communicable diseases: Secondary | ICD-10-CM | POA: Diagnosis not present

## 2019-12-02 DIAGNOSIS — L718 Other rosacea: Secondary | ICD-10-CM | POA: Diagnosis not present

## 2019-12-15 ENCOUNTER — Other Ambulatory Visit: Payer: Self-pay | Admitting: Obstetrics and Gynecology

## 2019-12-15 DIAGNOSIS — Z1231 Encounter for screening mammogram for malignant neoplasm of breast: Secondary | ICD-10-CM

## 2019-12-20 DIAGNOSIS — U099 Post covid-19 condition, unspecified: Secondary | ICD-10-CM | POA: Diagnosis not present

## 2019-12-20 DIAGNOSIS — E039 Hypothyroidism, unspecified: Secondary | ICD-10-CM | POA: Diagnosis not present

## 2019-12-20 DIAGNOSIS — F32A Depression, unspecified: Secondary | ICD-10-CM | POA: Diagnosis not present

## 2019-12-20 DIAGNOSIS — F329 Major depressive disorder, single episode, unspecified: Secondary | ICD-10-CM | POA: Diagnosis not present

## 2019-12-20 DIAGNOSIS — E7210 Disorders of sulfur-bearing amino-acid metabolism, unspecified: Secondary | ICD-10-CM | POA: Diagnosis not present

## 2019-12-20 DIAGNOSIS — E2749 Other adrenocortical insufficiency: Secondary | ICD-10-CM | POA: Diagnosis not present

## 2020-01-17 ENCOUNTER — Ambulatory Visit
Admission: RE | Admit: 2020-01-17 | Discharge: 2020-01-17 | Disposition: A | Discharge status: Home / Self Care | Payer: BLUE CROSS/BLUE SHIELD | Source: Ambulatory Visit | Attending: Obstetrics and Gynecology | Admitting: Obstetrics and Gynecology

## 2020-01-17 ENCOUNTER — Other Ambulatory Visit: Payer: Self-pay | Admitting: Body Imaging

## 2020-01-17 ENCOUNTER — Other Ambulatory Visit: Payer: Self-pay

## 2020-01-17 DIAGNOSIS — Z1231 Encounter for screening mammogram for malignant neoplasm of breast: Secondary | ICD-10-CM | POA: Diagnosis not present

## 2020-01-17 DIAGNOSIS — Z9189 Other specified personal risk factors, not elsewhere classified: Secondary | ICD-10-CM

## 2020-01-18 DIAGNOSIS — E039 Hypothyroidism, unspecified: Secondary | ICD-10-CM | POA: Diagnosis not present

## 2020-01-18 DIAGNOSIS — L719 Rosacea, unspecified: Secondary | ICD-10-CM | POA: Diagnosis not present

## 2020-01-21 ENCOUNTER — Other Ambulatory Visit: Payer: Self-pay | Admitting: Obstetrics and Gynecology

## 2020-01-21 DIAGNOSIS — R928 Other abnormal and inconclusive findings on diagnostic imaging of breast: Secondary | ICD-10-CM

## 2020-01-25 ENCOUNTER — Other Ambulatory Visit: Payer: Self-pay

## 2020-01-25 ENCOUNTER — Other Ambulatory Visit: Payer: Self-pay | Admitting: Obstetrics and Gynecology

## 2020-01-25 ENCOUNTER — Ambulatory Visit
Admission: RE | Admit: 2020-01-25 | Discharge: 2020-01-25 | Disposition: A | Discharge status: Home / Self Care | Payer: BLUE CROSS/BLUE SHIELD | Source: Ambulatory Visit | Attending: Obstetrics and Gynecology | Admitting: Obstetrics and Gynecology

## 2020-01-25 DIAGNOSIS — R921 Mammographic calcification found on diagnostic imaging of breast: Secondary | ICD-10-CM

## 2020-01-25 DIAGNOSIS — R928 Other abnormal and inconclusive findings on diagnostic imaging of breast: Secondary | ICD-10-CM

## 2020-01-26 ENCOUNTER — Ambulatory Visit
Admission: RE | Admit: 2020-01-26 | Discharge: 2020-01-26 | Disposition: A | Discharge status: Home / Self Care | Payer: BLUE CROSS/BLUE SHIELD | Source: Ambulatory Visit | Attending: Obstetrics and Gynecology | Admitting: Obstetrics and Gynecology

## 2020-01-26 DIAGNOSIS — D241 Benign neoplasm of right breast: Secondary | ICD-10-CM | POA: Diagnosis not present

## 2020-01-26 DIAGNOSIS — R921 Mammographic calcification found on diagnostic imaging of breast: Secondary | ICD-10-CM | POA: Diagnosis not present

## 2020-01-26 HISTORY — PX: BREAST BIOPSY: SHX20

## 2020-02-14 DIAGNOSIS — L820 Inflamed seborrheic keratosis: Secondary | ICD-10-CM | POA: Diagnosis not present

## 2020-02-14 DIAGNOSIS — L578 Other skin changes due to chronic exposure to nonionizing radiation: Secondary | ICD-10-CM | POA: Diagnosis not present

## 2020-02-14 DIAGNOSIS — Z85828 Personal history of other malignant neoplasm of skin: Secondary | ICD-10-CM | POA: Diagnosis not present

## 2020-02-14 DIAGNOSIS — L821 Other seborrheic keratosis: Secondary | ICD-10-CM | POA: Diagnosis not present

## 2020-02-14 DIAGNOSIS — L570 Actinic keratosis: Secondary | ICD-10-CM | POA: Diagnosis not present

## 2020-02-14 DIAGNOSIS — I788 Other diseases of capillaries: Secondary | ICD-10-CM | POA: Diagnosis not present

## 2020-04-05 DIAGNOSIS — E039 Hypothyroidism, unspecified: Secondary | ICD-10-CM | POA: Diagnosis not present

## 2020-04-05 DIAGNOSIS — E559 Vitamin D deficiency, unspecified: Secondary | ICD-10-CM | POA: Diagnosis not present

## 2020-04-05 DIAGNOSIS — Z0000 Encounter for general adult medical examination without abnormal findings: Secondary | ICD-10-CM | POA: Diagnosis not present

## 2020-04-12 ENCOUNTER — Other Ambulatory Visit: Payer: BLUE CROSS/BLUE SHIELD

## 2020-04-12 DIAGNOSIS — Z01419 Encounter for gynecological examination (general) (routine) without abnormal findings: Secondary | ICD-10-CM | POA: Diagnosis not present

## 2020-04-12 DIAGNOSIS — Z130 Encounter for screening for diseases of the blood and blood-forming organs and certain disorders involving the immune mechanism: Secondary | ICD-10-CM | POA: Diagnosis not present

## 2020-04-12 DIAGNOSIS — Z124 Encounter for screening for malignant neoplasm of cervix: Secondary | ICD-10-CM | POA: Diagnosis not present

## 2020-04-12 DIAGNOSIS — Z1389 Encounter for screening for other disorder: Secondary | ICD-10-CM | POA: Diagnosis not present

## 2020-04-13 DIAGNOSIS — Z124 Encounter for screening for malignant neoplasm of cervix: Secondary | ICD-10-CM | POA: Diagnosis not present

## 2020-04-14 DIAGNOSIS — R82998 Other abnormal findings in urine: Secondary | ICD-10-CM | POA: Diagnosis not present

## 2020-04-14 DIAGNOSIS — Z0000 Encounter for general adult medical examination without abnormal findings: Secondary | ICD-10-CM | POA: Diagnosis not present

## 2020-04-14 DIAGNOSIS — Z1331 Encounter for screening for depression: Secondary | ICD-10-CM | POA: Diagnosis not present

## 2020-04-14 DIAGNOSIS — R030 Elevated blood-pressure reading, without diagnosis of hypertension: Secondary | ICD-10-CM | POA: Diagnosis not present

## 2020-04-14 DIAGNOSIS — Z1339 Encounter for screening examination for other mental health and behavioral disorders: Secondary | ICD-10-CM | POA: Diagnosis not present

## 2020-04-17 DIAGNOSIS — Z1212 Encounter for screening for malignant neoplasm of rectum: Secondary | ICD-10-CM | POA: Diagnosis not present

## 2020-04-25 DIAGNOSIS — E039 Hypothyroidism, unspecified: Secondary | ICD-10-CM | POA: Diagnosis not present

## 2020-05-02 DIAGNOSIS — L820 Inflamed seborrheic keratosis: Secondary | ICD-10-CM | POA: Diagnosis not present

## 2020-06-28 DIAGNOSIS — F32A Depression, unspecified: Secondary | ICD-10-CM | POA: Diagnosis not present

## 2020-06-28 DIAGNOSIS — E7210 Disorders of sulfur-bearing amino-acid metabolism, unspecified: Secondary | ICD-10-CM | POA: Diagnosis not present

## 2020-06-28 DIAGNOSIS — E039 Hypothyroidism, unspecified: Secondary | ICD-10-CM | POA: Diagnosis not present

## 2020-06-28 DIAGNOSIS — K5900 Constipation, unspecified: Secondary | ICD-10-CM | POA: Diagnosis not present

## 2020-06-28 DIAGNOSIS — E559 Vitamin D deficiency, unspecified: Secondary | ICD-10-CM | POA: Diagnosis not present

## 2020-06-28 DIAGNOSIS — N951 Menopausal and female climacteric states: Secondary | ICD-10-CM | POA: Diagnosis not present

## 2020-06-28 DIAGNOSIS — Z789 Other specified health status: Secondary | ICD-10-CM | POA: Diagnosis not present

## 2020-06-28 DIAGNOSIS — E538 Deficiency of other specified B group vitamins: Secondary | ICD-10-CM | POA: Diagnosis not present

## 2020-08-03 DIAGNOSIS — E039 Hypothyroidism, unspecified: Secondary | ICD-10-CM | POA: Diagnosis not present

## 2020-08-03 DIAGNOSIS — E042 Nontoxic multinodular goiter: Secondary | ICD-10-CM | POA: Diagnosis not present

## 2020-08-03 DIAGNOSIS — N6489 Other specified disorders of breast: Secondary | ICD-10-CM | POA: Diagnosis not present

## 2020-08-14 DIAGNOSIS — Z20822 Contact with and (suspected) exposure to covid-19: Secondary | ICD-10-CM | POA: Diagnosis not present

## 2020-08-15 DIAGNOSIS — R1084 Generalized abdominal pain: Secondary | ICD-10-CM | POA: Diagnosis not present

## 2020-08-15 DIAGNOSIS — K529 Noninfective gastroenteritis and colitis, unspecified: Secondary | ICD-10-CM | POA: Diagnosis not present

## 2020-08-16 DIAGNOSIS — L821 Other seborrheic keratosis: Secondary | ICD-10-CM | POA: Diagnosis not present

## 2020-08-16 DIAGNOSIS — L738 Other specified follicular disorders: Secondary | ICD-10-CM | POA: Diagnosis not present

## 2020-08-16 DIAGNOSIS — Z85828 Personal history of other malignant neoplasm of skin: Secondary | ICD-10-CM | POA: Diagnosis not present

## 2020-08-16 DIAGNOSIS — L820 Inflamed seborrheic keratosis: Secondary | ICD-10-CM | POA: Diagnosis not present

## 2020-08-17 DIAGNOSIS — A09 Infectious gastroenteritis and colitis, unspecified: Secondary | ICD-10-CM | POA: Diagnosis not present

## 2020-08-18 DIAGNOSIS — R109 Unspecified abdominal pain: Secondary | ICD-10-CM | POA: Diagnosis not present

## 2020-08-18 DIAGNOSIS — R197 Diarrhea, unspecified: Secondary | ICD-10-CM | POA: Diagnosis not present

## 2020-08-18 DIAGNOSIS — A09 Infectious gastroenteritis and colitis, unspecified: Secondary | ICD-10-CM | POA: Diagnosis not present

## 2020-08-28 DIAGNOSIS — H2513 Age-related nuclear cataract, bilateral: Secondary | ICD-10-CM | POA: Diagnosis not present

## 2020-08-28 DIAGNOSIS — H5203 Hypermetropia, bilateral: Secondary | ICD-10-CM | POA: Diagnosis not present

## 2020-08-28 DIAGNOSIS — H524 Presbyopia: Secondary | ICD-10-CM | POA: Diagnosis not present

## 2020-11-09 DIAGNOSIS — L309 Dermatitis, unspecified: Secondary | ICD-10-CM | POA: Diagnosis not present

## 2020-11-21 DIAGNOSIS — E039 Hypothyroidism, unspecified: Secondary | ICD-10-CM | POA: Diagnosis not present

## 2020-11-23 DIAGNOSIS — E039 Hypothyroidism, unspecified: Secondary | ICD-10-CM | POA: Diagnosis not present

## 2020-11-29 DIAGNOSIS — L700 Acne vulgaris: Secondary | ICD-10-CM | POA: Diagnosis not present

## 2020-12-19 DIAGNOSIS — L700 Acne vulgaris: Secondary | ICD-10-CM | POA: Diagnosis not present

## 2020-12-28 ENCOUNTER — Other Ambulatory Visit: Payer: Self-pay | Admitting: Obstetrics and Gynecology

## 2020-12-28 DIAGNOSIS — Z1231 Encounter for screening mammogram for malignant neoplasm of breast: Secondary | ICD-10-CM

## 2021-01-04 DIAGNOSIS — M25812 Other specified joint disorders, left shoulder: Secondary | ICD-10-CM | POA: Diagnosis not present

## 2021-01-09 DIAGNOSIS — L821 Other seborrheic keratosis: Secondary | ICD-10-CM | POA: Diagnosis not present

## 2021-01-09 DIAGNOSIS — L700 Acne vulgaris: Secondary | ICD-10-CM | POA: Diagnosis not present

## 2021-01-09 DIAGNOSIS — D1801 Hemangioma of skin and subcutaneous tissue: Secondary | ICD-10-CM | POA: Diagnosis not present

## 2021-01-09 DIAGNOSIS — L570 Actinic keratosis: Secondary | ICD-10-CM | POA: Diagnosis not present

## 2021-01-09 DIAGNOSIS — L812 Freckles: Secondary | ICD-10-CM | POA: Diagnosis not present

## 2021-01-09 DIAGNOSIS — Z85828 Personal history of other malignant neoplasm of skin: Secondary | ICD-10-CM | POA: Diagnosis not present

## 2021-01-17 DIAGNOSIS — M5412 Radiculopathy, cervical region: Secondary | ICD-10-CM | POA: Diagnosis not present

## 2021-01-19 DIAGNOSIS — M25512 Pain in left shoulder: Secondary | ICD-10-CM | POA: Diagnosis not present

## 2021-01-29 DIAGNOSIS — M19012 Primary osteoarthritis, left shoulder: Secondary | ICD-10-CM | POA: Diagnosis not present

## 2021-01-29 DIAGNOSIS — M25561 Pain in right knee: Secondary | ICD-10-CM | POA: Diagnosis not present

## 2021-01-31 ENCOUNTER — Ambulatory Visit
Admission: RE | Admit: 2021-01-31 | Discharge: 2021-01-31 | Disposition: A | Discharge status: Home / Self Care | Payer: BC Managed Care – PPO | Source: Ambulatory Visit | Attending: Obstetrics and Gynecology | Admitting: Obstetrics and Gynecology

## 2021-01-31 ENCOUNTER — Other Ambulatory Visit: Payer: Self-pay

## 2021-01-31 DIAGNOSIS — Z1231 Encounter for screening mammogram for malignant neoplasm of breast: Secondary | ICD-10-CM

## 2021-02-03 IMAGING — MG DIGITAL SCREENING BILAT W/ TOMO W/ CAD
8 series · 9 of 24 positions shown · non-contrast
Comparison: Previous exam(s).

CLINICAL DATA: Screening.

EXAM:
DIGITAL SCREENING BILATERAL MAMMOGRAM WITH TOMO AND CAD

[R MLO synth-2D]
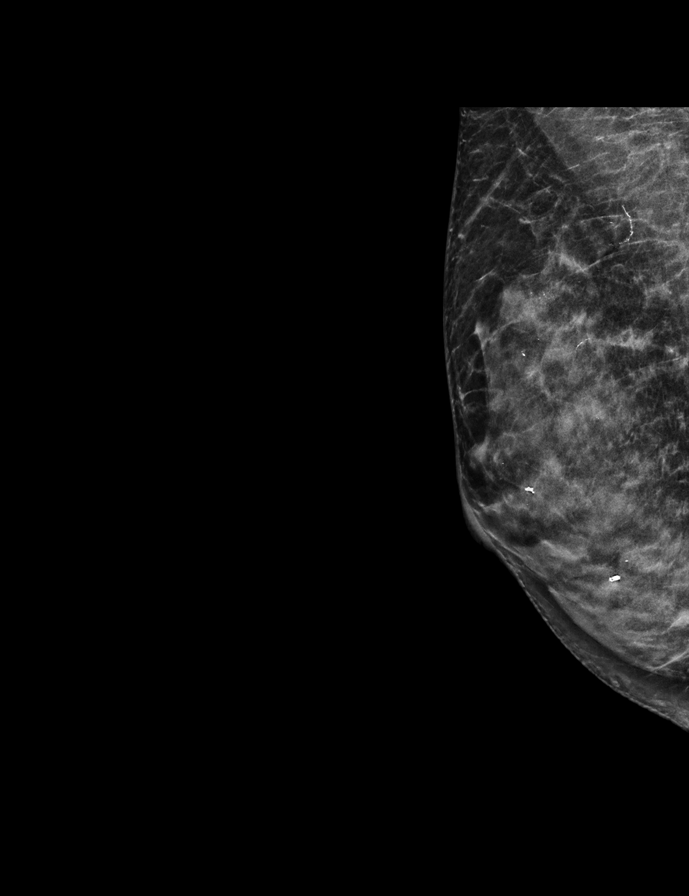

[L MLO synth-2D]
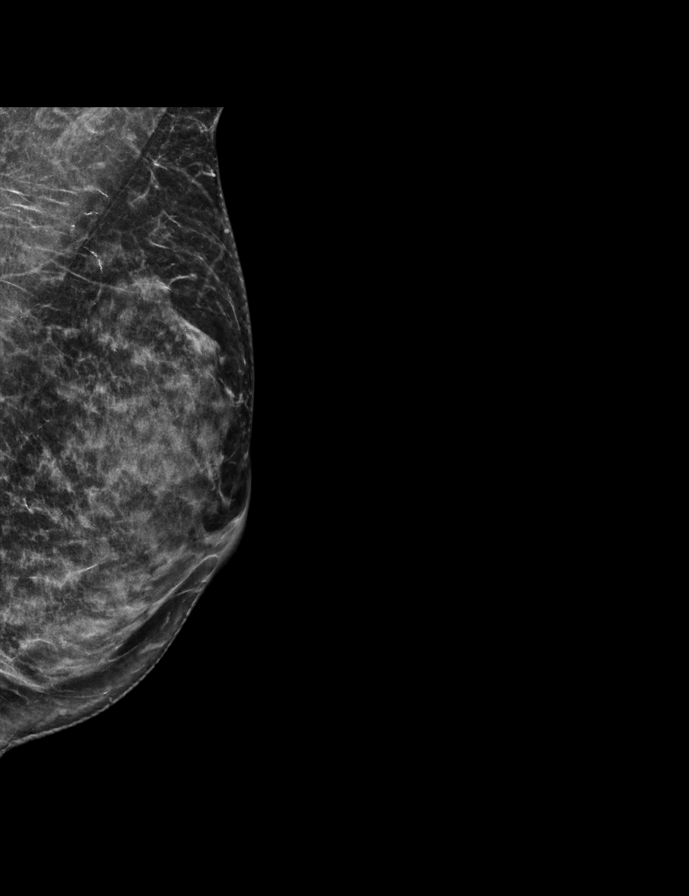

[R CC synth-2D]
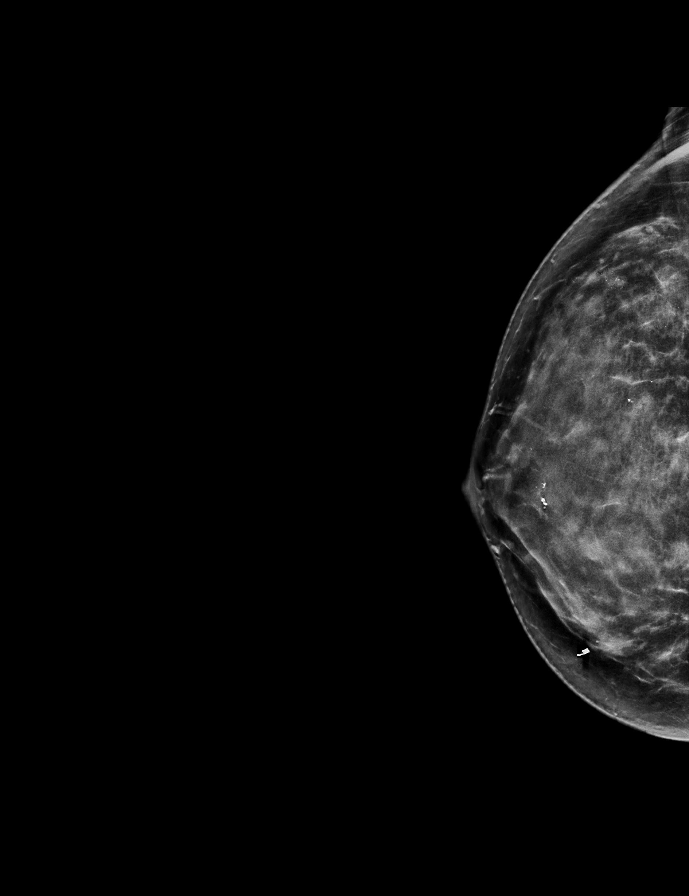

[L CC synth-2D]
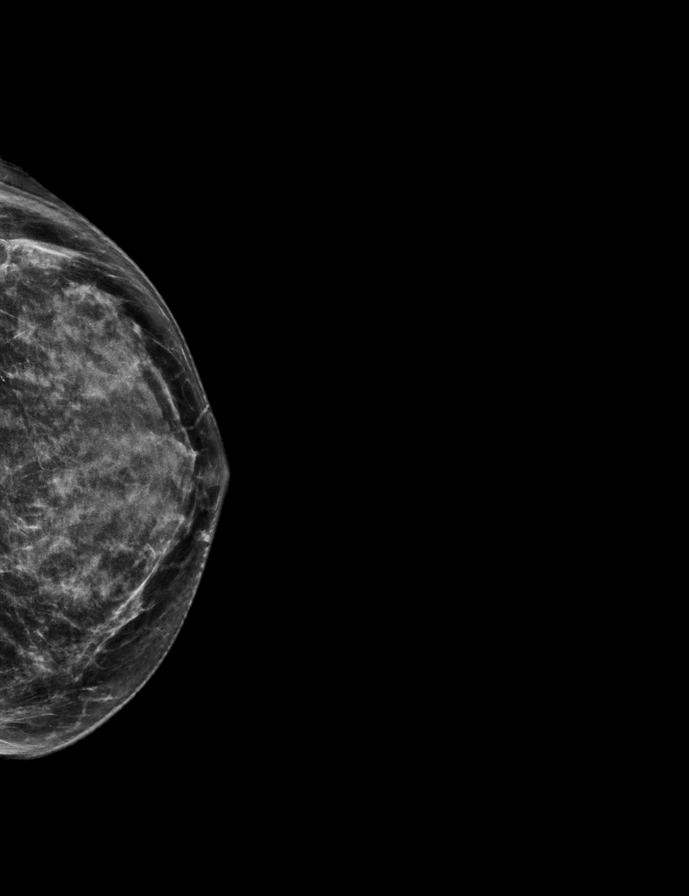

[R MLO tomo · 2 of 54 frames shown]
[frame 18/54]
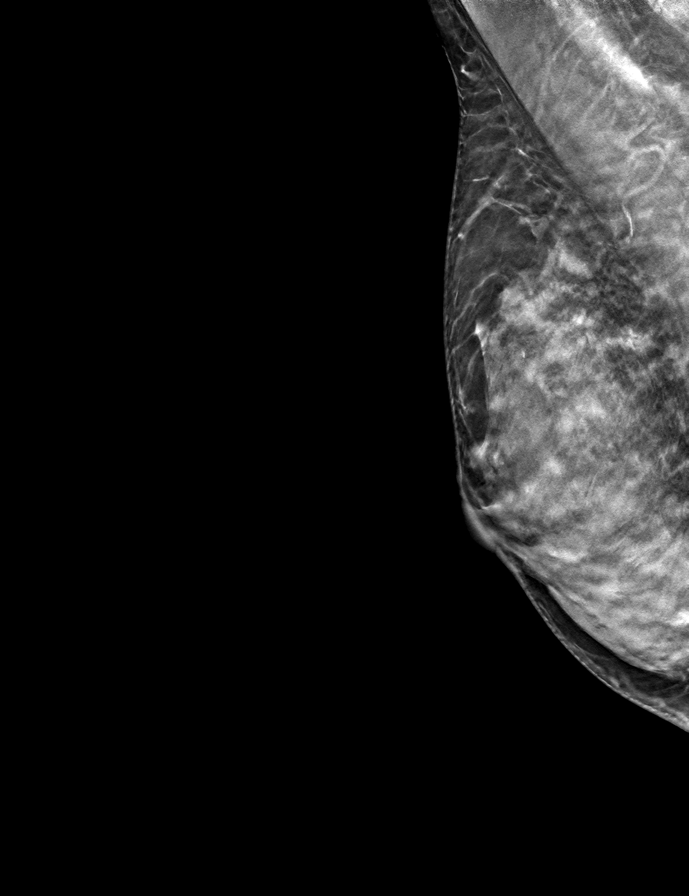
[frame 27/54]
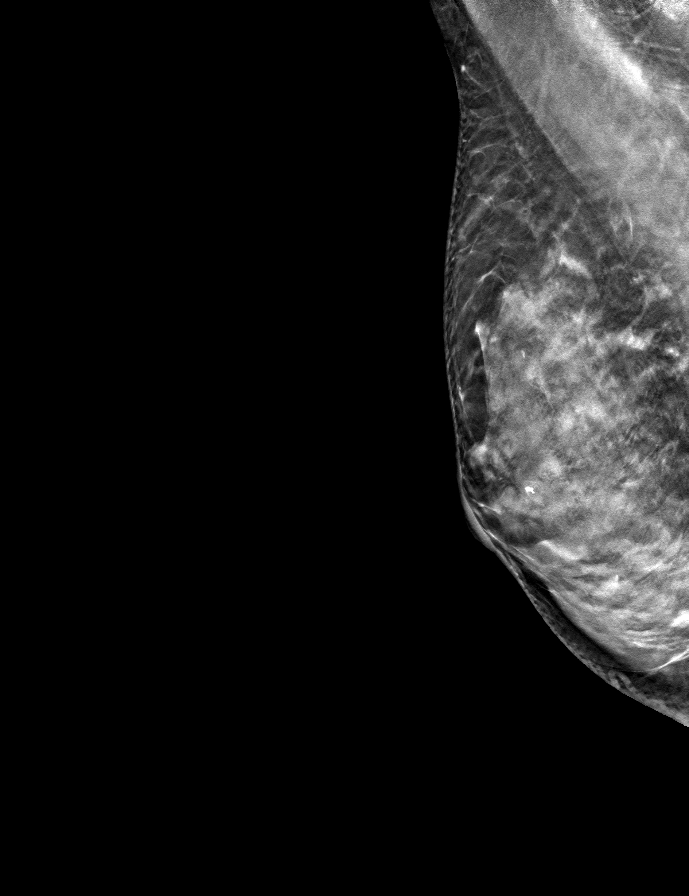

[L CC tomo · tomo slice 29/58.0]
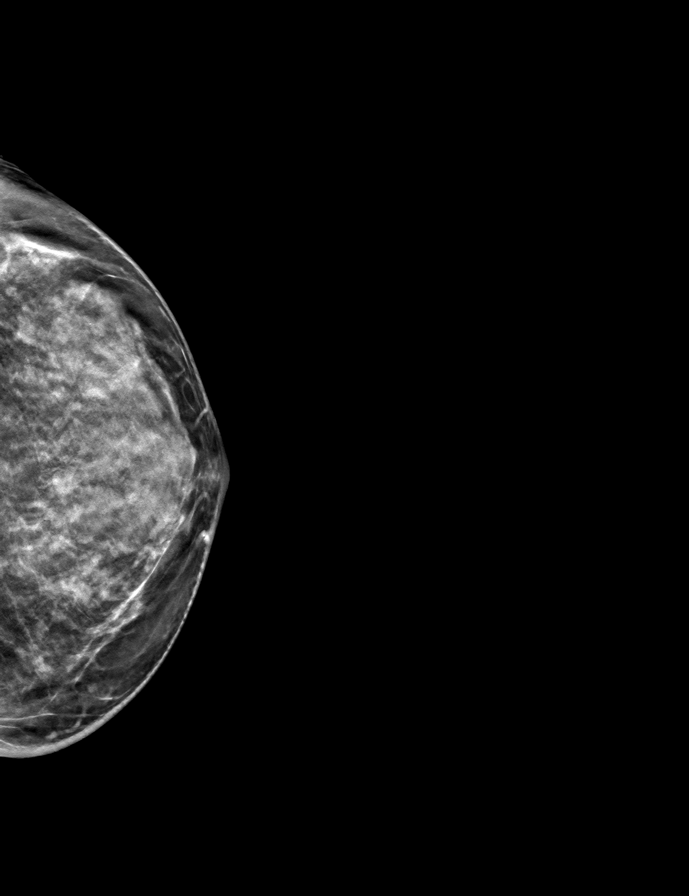

[L MLO tomo · tomo slice 28/55.0]
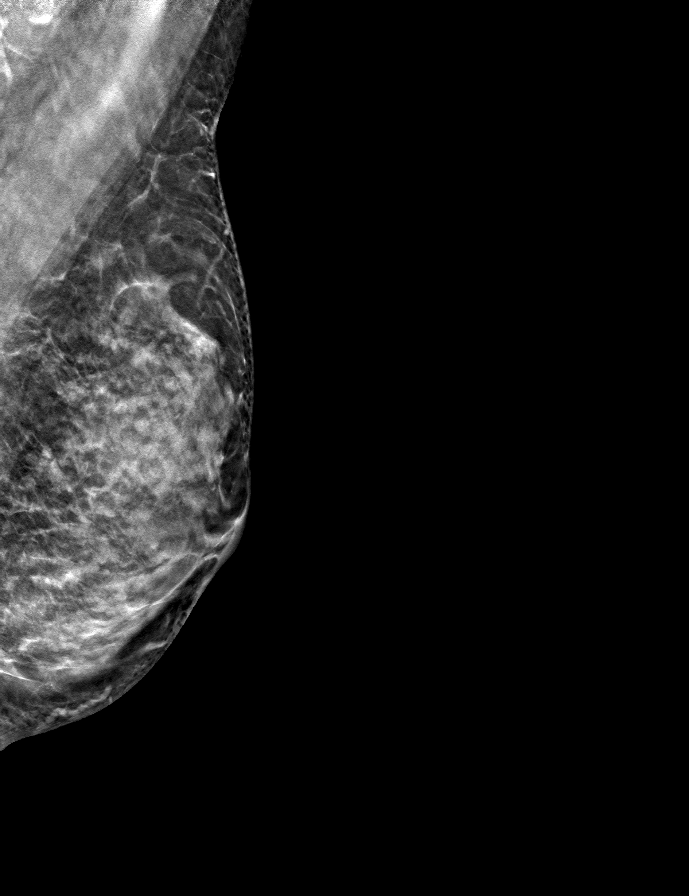

[R CC tomo · tomo slice 31/60.0]
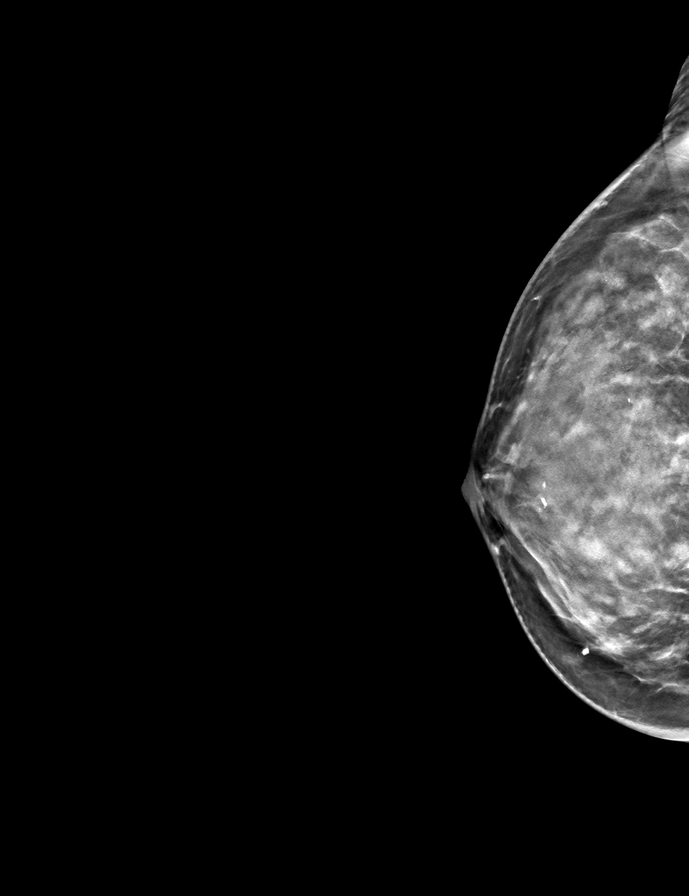

[9 of 24 positions shown; findings below may reference images not displayed]

ACR Breast Density Category c: The breast tissue is heterogeneously
dense, which may obscure small masses.
FINDINGS: In the right breast, calcifications warrant further evaluation with
magnified views. In the left breast, no findings suspicious for
malignancy. Images were processed with CAD.
IMPRESSION: Further evaluation is suggested for calcifications in the right
breast.

RECOMMENDATION:
Diagnostic mammogram of the right breast. (Code:[id number])

The patient will be contacted regarding the findings, and additional
imaging will be scheduled.

BI-RADS CATEGORY  0: Incomplete. Need additional imaging evaluation
and/or prior mammograms for comparison.

## 2021-03-08 DIAGNOSIS — E039 Hypothyroidism, unspecified: Secondary | ICD-10-CM | POA: Diagnosis not present

## 2021-03-12 DIAGNOSIS — E039 Hypothyroidism, unspecified: Secondary | ICD-10-CM | POA: Diagnosis not present

## 2021-04-18 DIAGNOSIS — Z01419 Encounter for gynecological examination (general) (routine) without abnormal findings: Secondary | ICD-10-CM | POA: Diagnosis not present

## 2021-04-18 DIAGNOSIS — N9089 Other specified noninflammatory disorders of vulva and perineum: Secondary | ICD-10-CM | POA: Diagnosis not present

## 2021-04-18 DIAGNOSIS — L821 Other seborrheic keratosis: Secondary | ICD-10-CM | POA: Diagnosis not present

## 2021-04-18 DIAGNOSIS — Z1389 Encounter for screening for other disorder: Secondary | ICD-10-CM | POA: Diagnosis not present

## 2021-04-18 DIAGNOSIS — L900 Lichen sclerosus et atrophicus: Secondary | ICD-10-CM | POA: Diagnosis not present

## 2021-04-18 DIAGNOSIS — Z124 Encounter for screening for malignant neoplasm of cervix: Secondary | ICD-10-CM | POA: Diagnosis not present

## 2021-04-18 DIAGNOSIS — Z130 Encounter for screening for diseases of the blood and blood-forming organs and certain disorders involving the immune mechanism: Secondary | ICD-10-CM | POA: Diagnosis not present

## 2021-04-23 DIAGNOSIS — E559 Vitamin D deficiency, unspecified: Secondary | ICD-10-CM | POA: Diagnosis not present

## 2021-04-23 DIAGNOSIS — I340 Nonrheumatic mitral (valve) insufficiency: Secondary | ICD-10-CM | POA: Diagnosis not present

## 2021-04-23 DIAGNOSIS — E039 Hypothyroidism, unspecified: Secondary | ICD-10-CM | POA: Diagnosis not present

## 2021-04-23 DIAGNOSIS — E7800 Pure hypercholesterolemia, unspecified: Secondary | ICD-10-CM | POA: Diagnosis not present

## 2021-04-30 DIAGNOSIS — Z1331 Encounter for screening for depression: Secondary | ICD-10-CM | POA: Diagnosis not present

## 2021-04-30 DIAGNOSIS — R030 Elevated blood-pressure reading, without diagnosis of hypertension: Secondary | ICD-10-CM | POA: Diagnosis not present

## 2021-04-30 DIAGNOSIS — Z1339 Encounter for screening examination for other mental health and behavioral disorders: Secondary | ICD-10-CM | POA: Diagnosis not present

## 2021-04-30 DIAGNOSIS — Z0000 Encounter for general adult medical examination without abnormal findings: Secondary | ICD-10-CM | POA: Diagnosis not present

## 2021-05-01 ENCOUNTER — Other Ambulatory Visit: Payer: Self-pay | Admitting: Internal Medicine

## 2021-05-01 DIAGNOSIS — R030 Elevated blood-pressure reading, without diagnosis of hypertension: Secondary | ICD-10-CM

## 2021-05-14 ENCOUNTER — Other Ambulatory Visit (HOSPITAL_COMMUNITY): Payer: Self-pay | Admitting: Internal Medicine

## 2021-05-14 DIAGNOSIS — R82998 Other abnormal findings in urine: Secondary | ICD-10-CM | POA: Diagnosis not present

## 2021-05-14 DIAGNOSIS — I340 Nonrheumatic mitral (valve) insufficiency: Secondary | ICD-10-CM

## 2021-05-17 ENCOUNTER — Ambulatory Visit (HOSPITAL_COMMUNITY)
Admission: RE | Admit: 2021-05-17 | Discharge: 2021-05-17 | Disposition: A | Discharge status: Home / Self Care | Payer: BC Managed Care – PPO | Source: Ambulatory Visit | Attending: Internal Medicine | Admitting: Internal Medicine

## 2021-05-17 ENCOUNTER — Other Ambulatory Visit: Payer: Self-pay

## 2021-05-17 DIAGNOSIS — I340 Nonrheumatic mitral (valve) insufficiency: Secondary | ICD-10-CM | POA: Diagnosis not present

## 2021-05-17 LAB — ECHOCARDIOGRAM COMPLETE
AR max vel: 2.33 cm2
AV Peak grad: 5.2 mmHg
Ao pk vel: 1.14 m/s
Area-P 1/2: 3.01 cm2
MV M vel: 5.02 m/s
MV Peak grad: 100.8 mmHg
S' Lateral: 1.7 cm
Single Plane A4C EF: 52.2 %

## 2021-05-17 NOTE — Progress Notes (Signed)
Echocardiogram ?2D Echocardiogram has been performed. ? ?Crystal Galvan ?05/17/2021, 8:38 AM ?

## 2021-06-06 ENCOUNTER — Ambulatory Visit
Admission: RE | Admit: 2021-06-06 | Discharge: 2021-06-06 | Disposition: A | Discharge status: Home / Self Care | Payer: No Typology Code available for payment source | Source: Ambulatory Visit | Attending: Internal Medicine | Admitting: Internal Medicine

## 2021-06-06 DIAGNOSIS — R030 Elevated blood-pressure reading, without diagnosis of hypertension: Secondary | ICD-10-CM

## 2021-06-12 DIAGNOSIS — E039 Hypothyroidism, unspecified: Secondary | ICD-10-CM | POA: Diagnosis not present

## 2021-06-15 DIAGNOSIS — Z1329 Encounter for screening for other suspected endocrine disorder: Secondary | ICD-10-CM | POA: Diagnosis not present

## 2021-06-15 DIAGNOSIS — E7800 Pure hypercholesterolemia, unspecified: Secondary | ICD-10-CM | POA: Diagnosis not present

## 2021-07-05 DIAGNOSIS — I2510 Atherosclerotic heart disease of native coronary artery without angina pectoris: Secondary | ICD-10-CM | POA: Diagnosis not present

## 2021-07-05 DIAGNOSIS — Z85828 Personal history of other malignant neoplasm of skin: Secondary | ICD-10-CM | POA: Diagnosis not present

## 2021-07-05 DIAGNOSIS — L570 Actinic keratosis: Secondary | ICD-10-CM | POA: Diagnosis not present

## 2021-07-05 DIAGNOSIS — L700 Acne vulgaris: Secondary | ICD-10-CM | POA: Diagnosis not present

## 2021-07-05 DIAGNOSIS — L821 Other seborrheic keratosis: Secondary | ICD-10-CM | POA: Diagnosis not present

## 2021-07-05 DIAGNOSIS — E039 Hypothyroidism, unspecified: Secondary | ICD-10-CM | POA: Diagnosis not present

## 2021-07-05 DIAGNOSIS — I2584 Coronary atherosclerosis due to calcified coronary lesion: Secondary | ICD-10-CM | POA: Diagnosis not present

## 2021-07-25 DIAGNOSIS — L700 Acne vulgaris: Secondary | ICD-10-CM | POA: Diagnosis not present

## 2021-08-28 DIAGNOSIS — R519 Headache, unspecified: Secondary | ICD-10-CM | POA: Diagnosis not present

## 2021-08-28 DIAGNOSIS — E7210 Disorders of sulfur-bearing amino-acid metabolism, unspecified: Secondary | ICD-10-CM | POA: Diagnosis not present

## 2021-08-29 DIAGNOSIS — H524 Presbyopia: Secondary | ICD-10-CM | POA: Diagnosis not present

## 2021-08-29 DIAGNOSIS — H43813 Vitreous degeneration, bilateral: Secondary | ICD-10-CM | POA: Diagnosis not present

## 2021-08-29 DIAGNOSIS — H5203 Hypermetropia, bilateral: Secondary | ICD-10-CM | POA: Diagnosis not present

## 2021-08-29 DIAGNOSIS — H2513 Age-related nuclear cataract, bilateral: Secondary | ICD-10-CM | POA: Diagnosis not present

## 2021-10-02 DIAGNOSIS — U099 Post covid-19 condition, unspecified: Secondary | ICD-10-CM | POA: Diagnosis not present

## 2021-10-02 DIAGNOSIS — E2749 Other adrenocortical insufficiency: Secondary | ICD-10-CM | POA: Diagnosis not present

## 2021-10-02 DIAGNOSIS — R519 Headache, unspecified: Secondary | ICD-10-CM | POA: Diagnosis not present

## 2021-10-11 DIAGNOSIS — J029 Acute pharyngitis, unspecified: Secondary | ICD-10-CM | POA: Diagnosis not present

## 2021-10-11 DIAGNOSIS — R6889 Other general symptoms and signs: Secondary | ICD-10-CM | POA: Diagnosis not present

## 2021-10-11 DIAGNOSIS — Z20822 Contact with and (suspected) exposure to covid-19: Secondary | ICD-10-CM | POA: Diagnosis not present

## 2021-10-11 DIAGNOSIS — R0981 Nasal congestion: Secondary | ICD-10-CM | POA: Diagnosis not present

## 2021-10-17 DIAGNOSIS — E039 Hypothyroidism, unspecified: Secondary | ICD-10-CM | POA: Diagnosis not present

## 2021-10-22 DIAGNOSIS — E039 Hypothyroidism, unspecified: Secondary | ICD-10-CM | POA: Diagnosis not present

## 2022-01-14 DIAGNOSIS — Z85828 Personal history of other malignant neoplasm of skin: Secondary | ICD-10-CM | POA: Diagnosis not present

## 2022-01-14 DIAGNOSIS — L821 Other seborrheic keratosis: Secondary | ICD-10-CM | POA: Diagnosis not present

## 2022-01-14 DIAGNOSIS — I788 Other diseases of capillaries: Secondary | ICD-10-CM | POA: Diagnosis not present

## 2022-01-14 DIAGNOSIS — L570 Actinic keratosis: Secondary | ICD-10-CM | POA: Diagnosis not present

## 2022-01-14 DIAGNOSIS — D1801 Hemangioma of skin and subcutaneous tissue: Secondary | ICD-10-CM | POA: Diagnosis not present

## 2022-02-04 DIAGNOSIS — M9901 Segmental and somatic dysfunction of cervical region: Secondary | ICD-10-CM | POA: Diagnosis not present

## 2022-02-04 DIAGNOSIS — M9905 Segmental and somatic dysfunction of pelvic region: Secondary | ICD-10-CM | POA: Diagnosis not present

## 2022-02-04 DIAGNOSIS — M9906 Segmental and somatic dysfunction of lower extremity: Secondary | ICD-10-CM | POA: Diagnosis not present

## 2022-02-04 DIAGNOSIS — M545 Low back pain, unspecified: Secondary | ICD-10-CM | POA: Diagnosis not present

## 2022-02-04 DIAGNOSIS — M9903 Segmental and somatic dysfunction of lumbar region: Secondary | ICD-10-CM | POA: Diagnosis not present

## 2022-02-04 DIAGNOSIS — M542 Cervicalgia: Secondary | ICD-10-CM | POA: Diagnosis not present

## 2022-02-19 DIAGNOSIS — I2510 Atherosclerotic heart disease of native coronary artery without angina pectoris: Secondary | ICD-10-CM | POA: Diagnosis not present

## 2022-02-28 ENCOUNTER — Other Ambulatory Visit: Payer: Self-pay | Admitting: Internal Medicine

## 2022-02-28 DIAGNOSIS — Z1231 Encounter for screening mammogram for malignant neoplasm of breast: Secondary | ICD-10-CM

## 2022-03-12 DIAGNOSIS — D485 Neoplasm of uncertain behavior of skin: Secondary | ICD-10-CM | POA: Diagnosis not present

## 2022-03-12 DIAGNOSIS — I788 Other diseases of capillaries: Secondary | ICD-10-CM | POA: Diagnosis not present

## 2022-03-12 DIAGNOSIS — Z85828 Personal history of other malignant neoplasm of skin: Secondary | ICD-10-CM | POA: Diagnosis not present

## 2022-03-12 DIAGNOSIS — L700 Acne vulgaris: Secondary | ICD-10-CM | POA: Diagnosis not present

## 2022-03-12 DIAGNOSIS — L570 Actinic keratosis: Secondary | ICD-10-CM | POA: Diagnosis not present

## 2022-03-12 DIAGNOSIS — C44629 Squamous cell carcinoma of skin of left upper limb, including shoulder: Secondary | ICD-10-CM | POA: Diagnosis not present

## 2022-04-10 ENCOUNTER — Ambulatory Visit
Admission: RE | Admit: 2022-04-10 | Discharge: 2022-04-10 | Disposition: A | Discharge status: Home / Self Care | Payer: 59 | Source: Ambulatory Visit | Attending: Internal Medicine | Admitting: Internal Medicine

## 2022-04-10 DIAGNOSIS — Z1231 Encounter for screening mammogram for malignant neoplasm of breast: Secondary | ICD-10-CM | POA: Diagnosis not present

## 2022-04-18 ENCOUNTER — Ambulatory Visit: Payer: BC Managed Care – PPO

## 2022-04-22 DIAGNOSIS — E039 Hypothyroidism, unspecified: Secondary | ICD-10-CM | POA: Diagnosis not present

## 2022-05-07 DIAGNOSIS — E559 Vitamin D deficiency, unspecified: Secondary | ICD-10-CM | POA: Diagnosis not present

## 2022-05-07 DIAGNOSIS — R030 Elevated blood-pressure reading, without diagnosis of hypertension: Secondary | ICD-10-CM | POA: Diagnosis not present

## 2022-05-07 DIAGNOSIS — E039 Hypothyroidism, unspecified: Secondary | ICD-10-CM | POA: Diagnosis not present

## 2022-05-07 DIAGNOSIS — Z780 Asymptomatic menopausal state: Secondary | ICD-10-CM | POA: Diagnosis not present

## 2022-05-07 DIAGNOSIS — F419 Anxiety disorder, unspecified: Secondary | ICD-10-CM | POA: Diagnosis not present

## 2022-05-07 DIAGNOSIS — E7800 Pure hypercholesterolemia, unspecified: Secondary | ICD-10-CM | POA: Diagnosis not present

## 2022-05-07 DIAGNOSIS — R7989 Other specified abnormal findings of blood chemistry: Secondary | ICD-10-CM | POA: Diagnosis not present

## 2022-05-14 DIAGNOSIS — R82998 Other abnormal findings in urine: Secondary | ICD-10-CM | POA: Diagnosis not present

## 2022-05-28 DIAGNOSIS — Z780 Asymptomatic menopausal state: Secondary | ICD-10-CM | POA: Diagnosis not present

## 2022-05-28 DIAGNOSIS — Z01419 Encounter for gynecological examination (general) (routine) without abnormal findings: Secondary | ICD-10-CM | POA: Diagnosis not present

## 2022-05-28 DIAGNOSIS — Z1389 Encounter for screening for other disorder: Secondary | ICD-10-CM | POA: Diagnosis not present

## 2022-05-28 DIAGNOSIS — Z130 Encounter for screening for diseases of the blood and blood-forming organs and certain disorders involving the immune mechanism: Secondary | ICD-10-CM | POA: Diagnosis not present

## 2022-06-10 DIAGNOSIS — E039 Hypothyroidism, unspecified: Secondary | ICD-10-CM | POA: Diagnosis not present

## 2022-06-10 DIAGNOSIS — R519 Headache, unspecified: Secondary | ICD-10-CM | POA: Diagnosis not present

## 2022-06-10 DIAGNOSIS — Z131 Encounter for screening for diabetes mellitus: Secondary | ICD-10-CM | POA: Diagnosis not present

## 2022-06-10 DIAGNOSIS — I2510 Atherosclerotic heart disease of native coronary artery without angina pectoris: Secondary | ICD-10-CM | POA: Diagnosis not present

## 2022-06-17 DIAGNOSIS — L738 Other specified follicular disorders: Secondary | ICD-10-CM | POA: Diagnosis not present

## 2022-06-17 DIAGNOSIS — Z85828 Personal history of other malignant neoplasm of skin: Secondary | ICD-10-CM | POA: Diagnosis not present

## 2022-06-17 DIAGNOSIS — L570 Actinic keratosis: Secondary | ICD-10-CM | POA: Diagnosis not present

## 2022-06-17 DIAGNOSIS — L821 Other seborrheic keratosis: Secondary | ICD-10-CM | POA: Diagnosis not present

## 2022-06-17 DIAGNOSIS — I788 Other diseases of capillaries: Secondary | ICD-10-CM | POA: Diagnosis not present

## 2022-07-01 ENCOUNTER — Ambulatory Visit: Payer: 59 | Admitting: Cardiology

## 2022-07-03 NOTE — Progress Notes (Signed)
Crystal Hilt, MD Reason for referral-coronary calcification  HPI: 72 year old female for evaluation of coronary calcification at request of Martha Clan, MD.  Echocardiogram March 2023 showed ejection fraction 60 to 65%, grade 1 diastolic dysfunction, mild mitral regurgitation.  Calcium score April 2023 225 which is 83rd percentile; aortic atherosclerosis.  Cardiology now asked to evaluate.  She does note some increased dyspnea on exertion but denies orthopnea, PND, pedal edema or syncope.  She does occasionally have palpitations described as a flutter.  She feels some chest heaviness at times but exercises routinely without having chest pain.  Current Outpatient Medications  Medication Sig Dispense Refill   cholecalciferol (VITAMIN D) 1000 UNITS tablet Take 1,000 Units by mouth daily.     doxycycline (VIBRAMYCIN) 100 MG capsule Take 100 mg by mouth 2 (two) times daily.  1   estradiol (VIVELLE-DOT) 0.05 MG/24HR patch Place 1 patch onto the skin 2 (two) times a week.     fish oil-omega-3 fatty acids 1000 MG capsule Take 1 g by mouth daily.     PROGESTERONE, VAGINAL, 8 % GEL Apply 1 Applicatorful topically every other day. For 12 days     SYNTHROID 50 MCG tablet TAKE 1 TABLET ONCE DAILY ON AN EMPTY STOMACH  3   Vortioxetine HBr (BRINTELLIX PO) Take 10 mg by mouth daily.  2   No current facility-administered medications for this visit.    No Known Allergies   Past Medical History:  Diagnosis Date   Complication of anesthesia    difficult being put to sleep   Coronary artery calcification    Depression    Headache(784.0)    occ   Hypothyroidism     Past Surgical History:  Procedure Laterality Date   BREAST BIOPSY Right 01/26/2020   BREAST BIOPSY Right 01/2019   COLONOSCOPY WITH PROPOFOL N/A 06/01/2012   Procedure: COLONOSCOPY WITH PROPOFOL;  Surgeon: Charolett Bumpers, MD;  Location: [facility];  Service: Endoscopy;  Laterality: N/A;   FACELIFT     KNEE  ARTHROSCOPY  03/05/2007   Right   Nasal suirgery      Social History   Socioeconomic History   Marital status: Married    Spouse name: Not on file   Number of children: 2   Years of education: Not on file   Highest education level: Not on file  Occupational History   Not on file  Tobacco Use   Smoking status: Never   Smokeless tobacco: Never  Substance and Sexual Activity   Alcohol use: Yes    Comment: Occasional   Drug use: No   Sexual activity: Not on file  Other Topics Concern   Not on file  Social History Narrative   Not on file   Social Determinants of Health   Financial Resource Strain: Not on file  Food Insecurity: Not on file  Transportation Needs: Not on file  Physical Activity: Not on file  Stress: Not on file  Social Connections: Not on file  Intimate Partner Violence: Not on file    Family History  Problem Relation Age of Onset   Heart attack Mother    Breast cancer Neg Hx     ROS: no fevers or chills, productive cough, hemoptysis, dysphasia, odynophagia, melena, hematochezia, dysuria, hematuria, rash, seizure activity, orthopnea, PND, pedal edema, claudication. Remaining systems are negative.  Physical Exam:   Blood pressure 124/66, pulse 73, height 5\' 8"  (1.727 m), weight 137 lb 3.2 oz (62.2 kg).  General:  Well  developed/well nourished in NAD Skin warm/dry Patient not depressed No peripheral clubbing Back-normal HEENT-normal/normal eyelids Neck supple/normal carotid upstroke bilaterally; no bruits; no JVD; no thyromegaly chest - CTA/ normal expansion CV - RRR/normal S1 and S2; no murmurs, rubs or gallops;  PMI nondisplaced Abdomen -NT/ND, no HSM, no mass, + bowel sounds, no bruit 2+ femoral pulses, no bruits Ext-no edema, chords, 2+ DP Neuro-grossly nonfocal  ECG -normal sinus rhythm with PVC, RV conduction delay.  Personally reviewed  A/P  1 coronary calcification-continue aspirin 81 mg daily.  I recommended a statin today but she  declines.  2 chest tightness/dyspnea-she occasionally has chest tightness that is not activity related.  However she does have coronary calcification noted on previous calcium score.  I will arrange a CTA to rule out obstructive coronary disease.  3 palpitations-she has occasional flutters and is concerned.  She will continue Toprol at present dose.  We will arrange a 7-day Zio patch to further assess.  4 hyperlipidemia-as outlined above given coronary calcification I recommended a statin today.  However she is concerned about potential side effects and declined.  Olga Millers, MD

## 2022-07-10 ENCOUNTER — Ambulatory Visit: Payer: 59 | Attending: Cardiology | Admitting: Cardiology

## 2022-07-10 ENCOUNTER — Ambulatory Visit (INDEPENDENT_AMBULATORY_CARE_PROVIDER_SITE_OTHER): Payer: 59

## 2022-07-10 ENCOUNTER — Encounter: Payer: Self-pay | Admitting: Cardiology

## 2022-07-10 VITALS — BP 124/66 | HR 73 | Ht 68.0 in | Wt 137.2 lb

## 2022-07-10 DIAGNOSIS — I2510 Atherosclerotic heart disease of native coronary artery without angina pectoris: Secondary | ICD-10-CM | POA: Diagnosis not present

## 2022-07-10 DIAGNOSIS — R002 Palpitations: Secondary | ICD-10-CM | POA: Diagnosis not present

## 2022-07-10 DIAGNOSIS — I2584 Coronary atherosclerosis due to calcified coronary lesion: Secondary | ICD-10-CM | POA: Diagnosis not present

## 2022-07-10 DIAGNOSIS — R0609 Other forms of dyspnea: Secondary | ICD-10-CM

## 2022-07-10 MED ORDER — METOPROLOL TARTRATE 100 MG PO TABS: ORAL_TABLET | ORAL | 0 refills | Status: DC

## 2022-07-10 NOTE — Patient Instructions (Signed)
Testing/Procedures:    Your cardiac CT will be scheduled at   [facility] [address] [place], [place] [address] [phone]    If scheduled at [facility], please arrive at the [facility] and Children's Entrance (Entrance C2) of [facility] 30 minutes prior to test start time. You can use the FREE valet parking offered at entrance C (encouraged to control the heart rate for the test)  Proceed to the [facility] Radiology Department (first floor) to check-in and test prep.  All radiology patients and guests should use entrance C2 at [facility], accessed from [facility], even though the hospital's physical address listed is [address].       Please follow these instructions carefully (unless otherwise directed):   On the Night Before the Test: Be sure to Drink plenty of water. Do not consume any caffeinated/decaffeinated beverages or chocolate 12 hours prior to your test. Do not take any antihistamines 12 hours prior to your test.  On the Day of the Test: Drink plenty of water until 1 hour prior to the test. Do not eat any food 1 hour prior to test. You may take your regular medications prior to the test.  Take metoprolol (Lopressor) 100 mg two hours prior to test. FEMALES- please wear underwire-free bra if available, avoid dresses & tight clothing       After the Test: Drink plenty of water. After receiving IV contrast, you may experience a mild flushed feeling. This is normal. On occasion, you may experience a mild rash up to 24 hours after the test. This is not dangerous. If this occurs, you can take Benadryl 25 mg and increase your fluid intake. If you experience trouble breathing, this can be serious. If it is severe call 911 IMMEDIATELY. If it is mild, please call our office.  We will call to schedule your test 2-4 weeks out understanding that some insurance companies will need an authorization prior  to the service being performed.   For non-scheduling related questions, please contact the cardiac imaging nurse navigator should you have any questions/concerns: Rockwell Alexandria, Cardiac Imaging Nurse Navigator Larey Brick, Cardiac Imaging Nurse Navigator [facility] Direct Office Dial: [phone]   For scheduling needs, including cancellations and rescheduling, please call [place], [phone].     ZIO XT- Long Term Monitor Instructions  Your physician has requested you wear a ZIO patch monitor for 7 days.  This is a single patch monitor. [employer] supplies one patch monitor per enrollment. Additional stickers are not available. Please do not apply patch if you will be having a Nuclear Stress Test,  Echocardiogram, Cardiac CT, MRI, or Chest Xray during the period you would be wearing the  monitor. The patch cannot be worn during these tests. You cannot remove and re-apply the  ZIO XT patch monitor.  Your ZIO patch monitor will be mailed 3 day USPS to your address on file. It may take 3-5 days  to receive your monitor after you have been enrolled.  Once you have received your monitor, please review the enclosed instructions. Your monitor  has already been registered assigning a specific monitor serial # to you.  Billing and Patient Assistance Program Information  We have supplied [employer] with any of your insurance information on file for billing purposes. [employer] offers a sliding scale Patient Assistance Program for patients that do not have  insurance, or whose insurance does not completely cover the cost  of the ZIO monitor.  You must apply for the Patient Assistance Program to qualify for this discounted rate.  To apply, please call [employer] at [phone], select option 4, select option 2, ask to apply for  Patient Assistance Program. Meredeth Ide will ask your household income, and how many people  are in your household. They will quote your out-of-pocket  cost based on that information.  [employer] will also be able to set up a 70-month, interest-free payment plan if needed.  Applying the monitor   Shave hair from upper left chest.  Hold abrader disc by orange tab. Rub abrader in 40 strokes over the upper left chest as  indicated in your monitor instructions.  Clean area with 4 enclosed alcohol pads. Let dry.  Apply patch as indicated in monitor instructions. Patch will be placed under collarbone on left  side of chest with arrow pointing upward.  Rub patch adhesive wings for 2 minutes. Remove white label marked "1". Remove the white  label marked "2". Rub patch adhesive wings for 2 additional minutes.  While looking in a mirror, press and release button in center of patch. A small green light will  flash 3-4 times. This will be your only indicator that the monitor has been turned on.  Do not shower for the first 24 hours. You may shower after the first 24 hours.  Press the button if you feel a symptom. You will hear a small click. Record Date, Time and  Symptom in the Patient Logbook.  When you are ready to remove the patch, follow instructions on the last 2 pages of Patient  Logbook. Stick patch monitor onto the last page of Patient Logbook.  Place Patient Logbook in the blue and white box. Use locking tab on box and tape box closed  securely. The blue and white box has prepaid postage on it. Please place it in the mailbox as  soon as possible. Your physician should have your test results approximately 7 days after the  monitor has been mailed back to [facility].  Call [employer] Customer Care at [phone] if you have questions regarding  your ZIO XT patch monitor. Call them immediately if you see an orange light blinking on your  monitor.  If your monitor falls off in less than 4 days, contact our Monitor department at [phone].  If your monitor becomes loose or falls off after 4 days call [employer] at [phone] for   suggestions on securing your monitor    Follow-Up: At [facility], you and your health needs are our priority.  As part of our continuing mission to provide you with exceptional heart care, we have created designated Provider Care Teams.  These Care Teams include your primary Cardiologist (physician) and Advanced Practice Providers (APPs -  Physician Assistants and Nurse Practitioners) who all work together to provide you with the care you need, when you need it.  We recommend signing up for the patient portal called "MyChart".  Sign up information is provided on this After Visit Summary.  MyChart is used to connect with patients for Virtual Visits (Telemedicine).  Patients are able to view lab/test results, encounter notes, upcoming appointments, etc.  Non-urgent messages can be sent to your provider as well.   To learn more about what you can do with MyChart, go to [email or web address].    Your next appointment:   12 month(s)  Provider:   Olga Millers MD

## 2022-07-10 NOTE — Progress Notes (Unsigned)
Enrolled patient for a 7 day Zio XT monitor to be mailed to patients home.  

## 2022-07-10 NOTE — Addendum Note (Signed)
Addended by: Dorris Fetch on: 07/10/2022 04:33 PM   Modules accepted: Orders

## 2022-07-17 ENCOUNTER — Telehealth: Payer: Self-pay | Admitting: Cardiology

## 2022-07-17 ENCOUNTER — Telehealth (HOSPITAL_COMMUNITY): Payer: Self-pay | Admitting: Emergency Medicine

## 2022-07-17 NOTE — Telephone Encounter (Signed)
Patient calling with questions for her CT tomorrow. She says it is in regards to medication but did not wish to give any further information.

## 2022-07-17 NOTE — Telephone Encounter (Signed)
Called patient, advised of CCTA instructions, pharmacy gave her 2 tablets of Metoprolol, however we discussed to make sure they were 100 mg tablets, and to only take the one tablet 2 hours before. Patient verbalized understanding, checked dosage on medication and they were 100 mg tablets.

## 2022-07-17 NOTE — Telephone Encounter (Signed)
Attempted to call patient regarding upcoming cardiac CT appointment. °Left message on voicemail with name and callback number °  RN Navigator Cardiac Imaging °[facility] °[phone] Office °[phone] Cell ° °

## 2022-07-18 ENCOUNTER — Ambulatory Visit (HOSPITAL_COMMUNITY)
Admission: RE | Admit: 2022-07-18 | Discharge: 2022-07-18 | Disposition: A | Discharge status: Home / Self Care | Payer: 59 | Source: Ambulatory Visit | Attending: Cardiology | Admitting: Cardiology

## 2022-07-18 ENCOUNTER — Ambulatory Visit (HOSPITAL_BASED_OUTPATIENT_CLINIC_OR_DEPARTMENT_OTHER)
Admission: RE | Admit: 2022-07-18 | Discharge: 2022-07-18 | Disposition: A | Discharge status: Home / Self Care | Payer: 59 | Source: Ambulatory Visit | Attending: Cardiology | Admitting: Cardiology

## 2022-07-18 ENCOUNTER — Encounter: Payer: Self-pay | Admitting: Cardiology

## 2022-07-18 ENCOUNTER — Other Ambulatory Visit: Payer: Self-pay | Admitting: *Deleted

## 2022-07-18 ENCOUNTER — Other Ambulatory Visit: Payer: Self-pay | Admitting: Cardiology

## 2022-07-18 DIAGNOSIS — I2584 Coronary atherosclerosis due to calcified coronary lesion: Secondary | ICD-10-CM | POA: Diagnosis not present

## 2022-07-18 DIAGNOSIS — R0609 Other forms of dyspnea: Secondary | ICD-10-CM | POA: Diagnosis not present

## 2022-07-18 DIAGNOSIS — R931 Abnormal findings on diagnostic imaging of heart and coronary circulation: Secondary | ICD-10-CM

## 2022-07-18 DIAGNOSIS — I2510 Atherosclerotic heart disease of native coronary artery without angina pectoris: Secondary | ICD-10-CM | POA: Insufficient documentation

## 2022-07-18 MED ORDER — NITROGLYCERIN 0.4 MG SL SUBL
0.8000 mg | SUBLINGUAL_TABLET | Freq: Once | SUBLINGUAL | Status: AC
Start: 2022-07-18 — End: 2022-07-18
  Administered 2022-07-18: 0.8 mg via SUBLINGUAL

## 2022-07-18 MED ORDER — IOHEXOL 350 MG/ML SOLN
95.0000 mL | Freq: Once | INTRAVENOUS | Status: AC | PRN
  Administered 2022-07-18: 95 mL via INTRAVENOUS

## 2022-07-18 MED ORDER — NITROGLYCERIN 0.4 MG SL SUBL
SUBLINGUAL_TABLET | SUBLINGUAL | Status: AC
  Filled 2022-07-18: qty 2

## 2022-07-30 DIAGNOSIS — Z131 Encounter for screening for diabetes mellitus: Secondary | ICD-10-CM | POA: Diagnosis not present

## 2022-07-30 DIAGNOSIS — R519 Headache, unspecified: Secondary | ICD-10-CM | POA: Diagnosis not present

## 2022-08-05 DIAGNOSIS — R002 Palpitations: Secondary | ICD-10-CM

## 2022-08-05 DIAGNOSIS — L570 Actinic keratosis: Secondary | ICD-10-CM | POA: Diagnosis not present

## 2022-08-05 DIAGNOSIS — D692 Other nonthrombocytopenic purpura: Secondary | ICD-10-CM | POA: Diagnosis not present

## 2022-08-05 DIAGNOSIS — Z85828 Personal history of other malignant neoplasm of skin: Secondary | ICD-10-CM | POA: Diagnosis not present

## 2022-08-17 DIAGNOSIS — R002 Palpitations: Secondary | ICD-10-CM | POA: Diagnosis not present

## 2022-08-19 ENCOUNTER — Telehealth: Payer: Self-pay | Admitting: Cardiology

## 2022-08-19 NOTE — Telephone Encounter (Signed)
Patient given monitor results. Discussed in detail.  Patient ask for Lipid panel.  Needs to know if this is for 3 months or prior to her yearly appt.   Please advise

## 2022-08-19 NOTE — Telephone Encounter (Signed)
Patient is returning RN's call for her echo results. Please advise. 

## 2022-08-20 NOTE — Telephone Encounter (Signed)
Left message for patient with Dr Creshaw's recommendations.   

## 2022-08-26 DIAGNOSIS — N951 Menopausal and female climacteric states: Secondary | ICD-10-CM | POA: Diagnosis not present

## 2022-08-26 DIAGNOSIS — E039 Hypothyroidism, unspecified: Secondary | ICD-10-CM | POA: Diagnosis not present

## 2022-09-18 DIAGNOSIS — R0981 Nasal congestion: Secondary | ICD-10-CM | POA: Diagnosis not present

## 2022-09-18 DIAGNOSIS — Z1152 Encounter for screening for COVID-19: Secondary | ICD-10-CM | POA: Diagnosis not present

## 2022-09-18 DIAGNOSIS — R030 Elevated blood-pressure reading, without diagnosis of hypertension: Secondary | ICD-10-CM | POA: Diagnosis not present

## 2022-09-18 DIAGNOSIS — B349 Viral infection, unspecified: Secondary | ICD-10-CM | POA: Diagnosis not present

## 2022-09-18 DIAGNOSIS — R109 Unspecified abdominal pain: Secondary | ICD-10-CM | POA: Diagnosis not present

## 2022-09-18 DIAGNOSIS — R5383 Other fatigue: Secondary | ICD-10-CM | POA: Diagnosis not present

## 2022-09-18 DIAGNOSIS — R638 Other symptoms and signs concerning food and fluid intake: Secondary | ICD-10-CM | POA: Diagnosis not present

## 2022-09-20 DIAGNOSIS — I2584 Coronary atherosclerosis due to calcified coronary lesion: Secondary | ICD-10-CM | POA: Diagnosis not present

## 2022-09-20 DIAGNOSIS — I2510 Atherosclerotic heart disease of native coronary artery without angina pectoris: Secondary | ICD-10-CM | POA: Diagnosis not present

## 2022-09-20 DIAGNOSIS — N951 Menopausal and female climacteric states: Secondary | ICD-10-CM | POA: Diagnosis not present

## 2022-09-20 LAB — LIPID PANEL
Chol/HDL Ratio: 2.6 ratio (ref 0.0–4.4)
Cholesterol, Total: 207 mg/dL — ABNORMAL HIGH (ref 100–199)
HDL: 79 mg/dL (ref >39)
LDL Chol Calc (NIH): 114 mg/dL — ABNORMAL HIGH (ref 0–99)
Triglycerides: 81 mg/dL (ref 0–149)
VLDL Cholesterol Cal: 14 mg/dL (ref 5–40)

## 2022-09-24 ENCOUNTER — Encounter: Payer: Self-pay | Admitting: *Deleted

## 2022-09-24 DIAGNOSIS — E538 Deficiency of other specified B group vitamins: Secondary | ICD-10-CM | POA: Diagnosis not present

## 2022-09-24 DIAGNOSIS — E559 Vitamin D deficiency, unspecified: Secondary | ICD-10-CM | POA: Diagnosis not present

## 2022-09-24 DIAGNOSIS — I2510 Atherosclerotic heart disease of native coronary artery without angina pectoris: Secondary | ICD-10-CM | POA: Diagnosis not present

## 2022-09-24 DIAGNOSIS — E039 Hypothyroidism, unspecified: Secondary | ICD-10-CM | POA: Diagnosis not present

## 2022-09-24 DIAGNOSIS — F32A Depression, unspecified: Secondary | ICD-10-CM | POA: Diagnosis not present

## 2022-10-02 DIAGNOSIS — R1084 Generalized abdominal pain: Secondary | ICD-10-CM | POA: Diagnosis not present

## 2022-10-02 DIAGNOSIS — Z1211 Encounter for screening for malignant neoplasm of colon: Secondary | ICD-10-CM | POA: Diagnosis not present

## 2022-10-02 DIAGNOSIS — N6315 Unspecified lump in the right breast, overlapping quadrants: Secondary | ICD-10-CM | POA: Diagnosis not present

## 2022-10-02 DIAGNOSIS — A09 Infectious gastroenteritis and colitis, unspecified: Secondary | ICD-10-CM | POA: Diagnosis not present

## 2022-10-03 ENCOUNTER — Other Ambulatory Visit: Payer: Self-pay | Admitting: Gynecology

## 2022-10-03 ENCOUNTER — Ambulatory Visit
Admission: RE | Admit: 2022-10-03 | Discharge: 2022-10-03 | Disposition: A | Discharge status: Home / Self Care | Payer: 59 | Source: Ambulatory Visit | Attending: Gynecology | Admitting: Gynecology

## 2022-10-03 ENCOUNTER — Ambulatory Visit
Admission: RE | Admit: 2022-10-03 | Discharge: 2022-10-03 | Disposition: A | Discharge status: Home / Self Care | Payer: Managed Care, Other (non HMO) | Source: Ambulatory Visit | Attending: Gynecology | Admitting: Gynecology

## 2022-10-03 DIAGNOSIS — R921 Mammographic calcification found on diagnostic imaging of breast: Secondary | ICD-10-CM | POA: Diagnosis not present

## 2022-10-03 DIAGNOSIS — N6315 Unspecified lump in the right breast, overlapping quadrants: Secondary | ICD-10-CM

## 2022-10-03 DIAGNOSIS — N6310 Unspecified lump in the right breast, unspecified quadrant: Secondary | ICD-10-CM

## 2022-10-07 DIAGNOSIS — H04123 Dry eye syndrome of bilateral lacrimal glands: Secondary | ICD-10-CM | POA: Diagnosis not present

## 2022-10-07 DIAGNOSIS — H2513 Age-related nuclear cataract, bilateral: Secondary | ICD-10-CM | POA: Diagnosis not present

## 2022-10-07 DIAGNOSIS — H25013 Cortical age-related cataract, bilateral: Secondary | ICD-10-CM | POA: Diagnosis not present

## 2022-10-07 DIAGNOSIS — H524 Presbyopia: Secondary | ICD-10-CM | POA: Diagnosis not present

## 2022-10-09 DIAGNOSIS — Z1211 Encounter for screening for malignant neoplasm of colon: Secondary | ICD-10-CM | POA: Diagnosis not present

## 2022-10-09 DIAGNOSIS — K5289 Other specified noninfective gastroenteritis and colitis: Secondary | ICD-10-CM | POA: Diagnosis not present

## 2022-10-09 DIAGNOSIS — R109 Unspecified abdominal pain: Secondary | ICD-10-CM | POA: Diagnosis not present

## 2022-10-09 DIAGNOSIS — K6389 Other specified diseases of intestine: Secondary | ICD-10-CM | POA: Diagnosis not present

## 2022-10-09 DIAGNOSIS — K5730 Diverticulosis of large intestine without perforation or abscess without bleeding: Secondary | ICD-10-CM | POA: Diagnosis not present

## 2022-10-10 ENCOUNTER — Ambulatory Visit
Admission: RE | Admit: 2022-10-10 | Discharge: 2022-10-10 | Disposition: A | Discharge status: Home / Self Care | Payer: Managed Care, Other (non HMO) | Source: Ambulatory Visit | Attending: Gynecology | Admitting: Gynecology

## 2022-10-10 DIAGNOSIS — R921 Mammographic calcification found on diagnostic imaging of breast: Secondary | ICD-10-CM

## 2022-10-10 DIAGNOSIS — N6315 Unspecified lump in the right breast, overlapping quadrants: Secondary | ICD-10-CM

## 2022-10-10 DIAGNOSIS — N6310 Unspecified lump in the right breast, unspecified quadrant: Secondary | ICD-10-CM

## 2022-10-10 HISTORY — PX: BREAST BIOPSY: SHX20

## 2022-10-15 ENCOUNTER — Other Ambulatory Visit: Payer: Self-pay | Admitting: Gynecology

## 2022-10-15 DIAGNOSIS — R928 Other abnormal and inconclusive findings on diagnostic imaging of breast: Secondary | ICD-10-CM

## 2022-10-16 ENCOUNTER — Ambulatory Visit
Admission: RE | Admit: 2022-10-16 | Discharge: 2022-10-16 | Discharge status: Home / Self Care | Payer: Managed Care, Other (non HMO) | Source: Ambulatory Visit | Attending: Gynecology | Admitting: Gynecology

## 2022-10-16 ENCOUNTER — Ambulatory Visit
Admission: RE | Admit: 2022-10-16 | Discharge: 2022-10-16 | Disposition: A | Discharge status: Home / Self Care | Payer: Managed Care, Other (non HMO) | Source: Ambulatory Visit | Attending: Gynecology | Admitting: Gynecology

## 2022-10-16 DIAGNOSIS — R928 Other abnormal and inconclusive findings on diagnostic imaging of breast: Secondary | ICD-10-CM

## 2022-10-16 DIAGNOSIS — R921 Mammographic calcification found on diagnostic imaging of breast: Secondary | ICD-10-CM | POA: Diagnosis not present

## 2022-10-16 HISTORY — PX: BREAST BIOPSY: SHX20

## 2022-10-21 ENCOUNTER — Telehealth: Payer: Self-pay | Admitting: *Deleted

## 2022-10-21 NOTE — Telephone Encounter (Signed)
Late entry 8/16- Spoke with patient to confirm [facility] appt for 10/30/22 at 12:15pm, Instructions given. Per patient request to mail [facility] paperwork. Verified address. Gave contact information for any further questions or concerns.

## 2022-10-28 ENCOUNTER — Telehealth: Payer: Self-pay | Admitting: Cardiology

## 2022-10-28 ENCOUNTER — Encounter: Payer: Self-pay | Admitting: *Deleted

## 2022-10-28 DIAGNOSIS — Z170 Estrogen receptor positive status [ER+]: Secondary | ICD-10-CM

## 2022-10-28 NOTE — Telephone Encounter (Signed)
Patient wants to have heart monitor results made available in her MyChart.

## 2022-10-28 NOTE — Telephone Encounter (Signed)
Call to patient to verify this is from June as these are released to My chart. LM if these are not correct or other question, please call

## 2022-10-29 NOTE — Telephone Encounter (Signed)
Called and LVM that the results are in MyChart.

## 2022-10-30 ENCOUNTER — Ambulatory Visit
Admission: RE | Admit: 2022-10-30 | Discharge: 2022-10-30 | Disposition: A | Discharge status: Home / Self Care | Payer: 59 | Source: Ambulatory Visit | Attending: Radiation Oncology | Admitting: Radiation Oncology

## 2022-10-30 ENCOUNTER — Ambulatory Visit: Payer: 59 | Admitting: Physical Therapy

## 2022-10-30 ENCOUNTER — Encounter: Payer: Self-pay | Admitting: Hematology and Oncology

## 2022-10-30 ENCOUNTER — Inpatient Hospital Stay: Payer: 59 | Attending: Hematology and Oncology

## 2022-10-30 ENCOUNTER — Encounter: Payer: Self-pay | Admitting: General Practice

## 2022-10-30 ENCOUNTER — Other Ambulatory Visit: Payer: Self-pay | Admitting: General Surgery

## 2022-10-30 ENCOUNTER — Inpatient Hospital Stay (HOSPITAL_BASED_OUTPATIENT_CLINIC_OR_DEPARTMENT_OTHER): Payer: 59 | Admitting: Hematology and Oncology

## 2022-10-30 VITALS — BP 145/65 | HR 100 | Temp 97.3°F | Resp 18 | Ht 68.0 in | Wt 130.2 lb

## 2022-10-30 DIAGNOSIS — C50511 Malignant neoplasm of lower-outer quadrant of right female breast: Secondary | ICD-10-CM

## 2022-10-30 DIAGNOSIS — Z170 Estrogen receptor positive status [ER+]: Secondary | ICD-10-CM | POA: Insufficient documentation

## 2022-10-30 DIAGNOSIS — E041 Nontoxic single thyroid nodule: Secondary | ICD-10-CM

## 2022-10-30 LAB — CBC WITH DIFFERENTIAL (CANCER CENTER ONLY)
Abs Immature Granulocytes: 0.01 10*3/uL (ref 0.00–0.07)
Basophils Absolute: 0 10*3/uL (ref 0.0–0.1)
Basophils Relative: 1 %
Eosinophils Absolute: 0.1 10*3/uL (ref 0.0–0.5)
Eosinophils Relative: 2 %
HCT: 40 % (ref 36.0–46.0)
Hemoglobin: 13.4 g/dL (ref 12.0–15.0)
Immature Granulocytes: 0 %
Lymphocytes Relative: 25 %
Lymphs Abs: 1.5 10*3/uL (ref 0.7–4.0)
MCH: 31.5 pg (ref 26.0–34.0)
MCHC: 33.5 g/dL (ref 30.0–36.0)
MCV: 94.1 fL (ref 80.0–100.0)
Monocytes Absolute: 0.4 10*3/uL (ref 0.1–1.0)
Monocytes Relative: 6 %
Neutro Abs: 4.1 10*3/uL (ref 1.7–7.7)
Neutrophils Relative %: 66 %
Platelet Count: 207 10*3/uL (ref 150–400)
RBC: 4.25 MIL/uL (ref 3.87–5.11)
RDW: 12.5 % (ref 11.5–15.5)
WBC Count: 6.1 10*3/uL (ref 4.0–10.5)
nRBC: 0 % (ref 0.0–0.2)

## 2022-10-30 LAB — CMP (CANCER CENTER ONLY)
ALT: 12 U/L (ref 0–44)
AST: 18 U/L (ref 15–41)
Albumin: 4.4 g/dL (ref 3.5–5.0)
Alkaline Phosphatase: 42 U/L (ref 38–126)
Anion gap: 5 (ref 5–15)
BUN: 20 mg/dL (ref 8–23)
CO2: 31 mmol/L (ref 22–32)
Calcium: 9.5 mg/dL (ref 8.9–10.3)
Chloride: 105 mmol/L (ref 98–111)
Creatinine: 0.8 mg/dL (ref 0.44–1.00)
GFR, Estimated: >60 mL/min (ref >60)
Glucose, Bld: 99 mg/dL (ref 70–99)
Potassium: 4.3 mmol/L (ref 3.5–5.1)
Sodium: 141 mmol/L (ref 135–145)
Total Bilirubin: 0.6 mg/dL (ref 0.3–1.2)
Total Protein: 6.9 g/dL (ref 6.5–8.1)

## 2022-10-30 LAB — GENETIC SCREENING ORDER

## 2022-10-30 NOTE — Assessment & Plan Note (Signed)
10/16/2022:Palpable right breast mass posteriorly with calcifications.  The calcifications were benign.  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 After reviewing the pathology in detail, we proceeded to discuss the different treatment options between surgery, radiation, chemotherapy, antiestrogen therapies.  Recommendations: 1. Breast conserving surgery followed by 2. Oncotype DX testing to determine if chemotherapy would be of any benefit followed by 3. Adjuvant radiation therapy followed by 4. Adjuvant antiestrogen therapy  Oncotype counseling: I discussed Oncotype DX test. I explained to the patient that this is a 21 gene panel to evaluate patient tumors DNA to calculate recurrence score. This would help determine whether patient has high risk or low risk breast cancer. She understands that if her tumor was found to be high risk, she would benefit from systemic chemotherapy. If low risk, no need of chemotherapy.  Return to clinic after surgery to discuss final pathology report and then determine if Oncotype DX testing will need to be sent.

## 2022-10-30 NOTE — Progress Notes (Signed)
[facility] CONSULT NOTE  Patient Care Team: Cleatis Polka., MD as PCP - General (Internal Medicine) Donnelly Angelica, RN as Oncology Nurse Navigator Pershing Proud, RN as Oncology Nurse Navigator Serena Croissant, MD as Consulting Physician (Hematology and Oncology) [name], MD as Consulting Physician (General Surgery) Antony Blackbird, MD as Consulting Physician (Radiation Oncology)  CHIEF COMPLAINTS/PURPOSE OF CONSULTATION:  Newly diagnosed breast cancer  HISTORY OF PRESENTING ILLNESS:  Crystal Galvan 72 y.o. female is here because of recent diagnosis of right breast cancer.  Patient felt a right breast mass and mammogram detected a 1 cm mass at 6 o'clock position which biopsy came back as grade 1 IDC with intermediate grade DCIS that is ER/PR positive HER2 negative with a Ki-67 of 10%.  She is accompanied by her husband today.  She was presented this morning in the multidisciplinary tumor board and she is here today to discuss her treatment plan.  She follows with [facility] and is hoping to use complementary therapies for her treatment.  I reviewed her records extensively and collaborated the history with the patient.  SUMMARY OF ONCOLOGIC HISTORY: Oncology History  Malignant neoplasm of lower-outer quadrant of right breast of female, estrogen receptor positive (HCC)  10/16/2022 Initial Diagnosis   Palpable right breast mass posteriorly with calcifications.  The calcifications were benign.  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%, PR 100%, Ki67 10%, HER2 IHC negative   10/30/2022 Cancer Staging   Staging form: Breast, AJCC 8th Edition - Clinical: Stage IA (cT1b, cN0, cM0, G1, ER+, PR+, HER2-) - Signed by Serena Croissant, MD on 10/30/2022 Histologic grading system: 3 grade system      MEDICAL HISTORY:  Past Medical History:  Diagnosis Date   Breast cancer  (HCC)    Complication of anesthesia    difficult being put to sleep   Coronary artery calcification    Depression    Headache(784.0)    occ   Hypothyroidism     SURGICAL HISTORY: Past Surgical History:  Procedure Laterality Date   BREAST BIOPSY Right 01/26/2020   BREAST BIOPSY Right 01/2019   BREAST BIOPSY Right 10/10/2022   [place] RT BREAST BX W LOC DEV 1ST LESION IMG BX SPEC US GUIDE 10/10/2022 [facility]   BREAST BIOPSY Right 10/10/2022   MM RT BREAST BX W LOC DEV 1ST LESION IMAGE BX SPEC STEREO GUIDE 10/10/2022 [facility]   BREAST BIOPSY Right 10/10/2022   MM RT BREAST BX W LOC DEV EA AD LESION IMG BX SPEC STEREO GUIDE 10/10/2022 [facility]   BREAST BIOPSY Right 10/16/2022   MM RT BREAST BX W LOC DEV 1ST LESION IMAGE BX SPEC STEREO GUIDE 10/16/2022 [facility]   COLONOSCOPY WITH PROPOFOL N/A 06/01/2012   Procedure: COLONOSCOPY WITH PROPOFOL;  Surgeon: Charolett Bumpers, MD;  Location: [facility];  Service: Endoscopy;  Laterality: N/A;   FACELIFT     KNEE ARTHROSCOPY  03/05/2007   Right   Nasal suirgery      SOCIAL HISTORY: Social History   Socioeconomic History   Marital status: Married    Spouse name: Not on file   Number of children: 2   Years of education: Not on file   Highest education level: Not on file  Occupational History   Not on file  Tobacco Use   Smoking status: Never   Smokeless tobacco: Never  Substance and  Sexual Activity   Alcohol use: Yes    Comment: Occasional   Drug use: No   Sexual activity: Not on file  Other Topics Concern   Not on file  Social History Narrative   Not on file   Social Determinants of Health   Financial Resource Strain: Not on file  Food Insecurity: No Food Insecurity (03/12/2021)   Received from [facility], [facility]   Hunger Vital Sign    Worried About Running Out of Food in the Last Year: Never true    Ran Out of Food in the Last Year: Never true  Transportation Needs: Not on file   Physical Activity: Not on file  Stress: Not on file  Social Connections: Unknown (07/16/2021)   Received from [facility], [facility]   Social Network    Social Network: Not on file  Intimate Partner Violence: Unknown (06/07/2021)   Received from [facility], [facility]   HITS    Physically Hurt: Not on file    Insult or Talk Down To: Not on file    Threaten Physical Harm: Not on file    Scream or Curse: Not on file    FAMILY HISTORY: Family History  Problem Relation Age of Onset   Heart attack Mother    Breast cancer Neg Hx     ALLERGIES:  has No Known Allergies.  MEDICATIONS:  Current Outpatient Medications  Medication Sig Dispense Refill   aspirin EC 81 MG tablet Take 81 mg by mouth daily. Swallow whole.     metoprolol succinate (TOPROL-XL) 25 MG 24 hr tablet Take 25 mg by mouth daily.     thyroid (ARMOUR) 30 MG tablet Take 30 mg by mouth daily before breakfast.     Vortioxetine HBr (BRINTELLIX PO) Take 10 mg by mouth daily.  2   cholecalciferol (VITAMIN D) 1000 UNITS tablet Take 1,000 Units by mouth daily.     estradiol (VIVELLE-DOT) 0.05 MG/24HR patch Place 1 patch onto the skin 2 (two) times a week.     No current facility-administered medications for this visit.    REVIEW OF SYSTEMS:   Constitutional: Denies fevers, chills or abnormal night sweats   All other systems were reviewed with the patient and are negative.  PHYSICAL EXAMINATION: ECOG PERFORMANCE STATUS: 1 - Symptomatic but completely ambulatory  Vitals:   10/30/22 1252  BP: (!) 145/65  Pulse: 100  Resp: 18  Temp: (!) 97.3 F (36.3 C)  SpO2: 100%   Filed Weights   10/30/22 1252  Weight: 130 lb 3.2 oz (59.1 kg)    GENERAL:alert, no distress and comfortable    LABORATORY DATA:  I have reviewed the data as listed Lab Results  Component Value Date   WBC 6.1 10/30/2022   HGB 13.4 10/30/2022   HCT 40.0 10/30/2022   MCV 94.1 10/30/2022   PLT 207 10/30/2022   Lab Results   Component Value Date   NA 141 10/30/2022   K 4.3 10/30/2022   CL 105 10/30/2022   CO2 31 10/30/2022    RADIOGRAPHIC STUDIES: I have personally reviewed the radiological reports and agreed with the findings in the report.  ASSESSMENT AND PLAN:  Malignant neoplasm of lower-outer quadrant of right breast of female, estrogen receptor positive (HCC) 10/16/2022:Palpable right breast mass posteriorly with calcifications.  The calcifications were benign.  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 After reviewing the pathology in detail, we proceeded to discuss the different treatment options between surgery, radiation, chemotherapy, antiestrogen therapies.  Recommendations: 1. Breast conserving surgery followed by 2. Oncotype DX testing to determine if chemotherapy would be of any benefit followed by 3. Adjuvant radiation therapy followed by 4. Adjuvant antiestrogen therapy: Patient had been on estrogen replacement therapy and she stopped it.  She does not like the way she feels.  She is likely not going to accept antiestrogen therapy.  Patient is very interested in complementary/alternative treatment options.  She is hoping to join a breast cancer program through integrative medicine that combines vitamin C infusions along with other complementary treatments.  She has been reading a breast cancer book that talks about dental infections and a dental root canals and implants can increase the cancer risk  Oncotype counseling: I discussed Oncotype DX test. I explained to the patient that this is a 21 gene panel to evaluate patient tumors DNA to calculate  recurrence score. This would help determine whether patient has high risk or low risk breast cancer. She understands that if her tumor was found to be high risk, she would benefit from systemic chemotherapy. If low risk, no need of chemotherapy.  Return to clinic after surgery to discuss final pathology report and then determine if Oncotype DX testing will need to be sent.   All questions were answered. The patient knows to call the clinic with any problems, questions or concerns.    Tamsen Meek, MD 10/30/22

## 2022-10-30 NOTE — Progress Notes (Signed)
[facility] Spiritual Care Note  Met with Huyen and her husband in [facility] to introduce Support Center team/resources.  She completed SDOH screening; results follow below.    SDOH Screenings   Food Insecurity: No Food Insecurity (10/30/2022)  Housing: Low Risk  (10/30/2022)  Transportation Needs: No Transportation Needs (10/30/2022)  Utilities: Not At Risk (10/30/2022)  Depression (PHQ2-9): Low Risk  (10/30/2022)  Social Connections: Unknown (07/16/2021)   Received from [facility], [facility]  Tobacco Use: Low Risk  (10/30/2022)    Chaplain and patient discussed common feelings and emotions when being diagnosed with cancer, and the importance of support during treatment.  Chaplain informed patient of the support team and support services at [facility].  Chaplain provided contact information and encouraged patient to call with any questions or concerns.  Ms Empey reports good support and has interest in programming. Faith and prayer are important meaning-making parts of her life.  Follow up needed: Yes.  Ms [facility] a follow-up call in ca two weeks.   [address] [place], [place], [facility] Pager [phone] Voicemail [phone]

## 2022-10-30 NOTE — Progress Notes (Signed)
Radiation Oncology         [phone] ________________________________  Multidisciplinary Breast Oncology Clinic [facility]) Initial Outpatient Consultation  Name: Crystal Galvan MRN: [id number]  Date: 10/30/2022  DOB: 1950/10/04  UK:GURK, Netta Corrigan., MD  [name], MD   REFERRING PHYSICIAN: [name], MD  DIAGNOSIS: The encounter diagnosis was Malignant neoplasm of lower-outer quadrant of right breast of female, estrogen receptor positive (HCC).  Stage IA (cT1b, cN0, cM0) Right Breast LOQ, Invasive ductal carcinoma with intermediate grade DCIS, ER+ / PR+ / Her2-, Grade 1     ICD-10-CM   1. Malignant neoplasm of lower-outer quadrant of right breast of female, estrogen receptor positive (HCC)  C50.511    Z17.0       HISTORY OF PRESENT ILLNESS::Crystal Galvan is a 72 y.o. female who is presenting to the office today for evaluation of her newly diagnosed breast cancer. She is accompanied by her husband. She is doing well overall.   She presented with a palpable mass in the right breast. She subsequently underwent a right breast diagnostic mammography with tomography and right breast ultrasonography at [facility] on 10[facility]03[facility]22 showing: a 1.0 x 0.8 x 0.8 cm irregular hypoechoic mass in the 6 o'clock right breast located 5 cmfn correlating with the palpable site of concern. No right axillary lymphadenopathy was appreciated, though multiple areas of calcifications in the right breast were also appreciated.   Biopsy of the 6 o'clock right breast mass on 10/10/22 showed: grade 1 invasive ductal carcinoma measuring 3 mm with intermediate grade DCIS.. Prognostic indicators significant for: estrogen receptor, 95% positive and progesterone receptor, 100% positive, both with strong staining intensity. Proliferation marker Ki67 at 10%. HER2 negative.  Biopsy of the right breast calcifications x 2 also collected on that date both showed no evidence of malignancy.   Menarche: 89-74  years old Age at first live birth: 72 years old GP: 2 LMP: postmenopausal (LMP date not indicated on the provided form) Contraceptive: never used HRT: yes, 22 years ago and stopped 10 days ago   The patient was referred today for presentation in the multidisciplinary conference.  Radiology studies and pathology slides were presented there for review and discussion of treatment options.  A consensus was discussed regarding potential next steps.  PREVIOUS RADIATION THERAPY: No  PAST MEDICAL HISTORY:  Past Medical History:  Diagnosis Date   Breast cancer (HCC)    Complication of anesthesia    difficult being put to sleep   Coronary artery calcification    Depression    Headache(784.0)    occ   Hypothyroidism     PAST SURGICAL HISTORY: Past Surgical History:  Procedure Laterality Date   BREAST BIOPSY Right 01/26/2020   BREAST BIOPSY Right 01/2019   BREAST BIOPSY Right 10/10/2022   [place] RT BREAST BX W LOC DEV 1ST LESION IMG BX SPEC US GUIDE 10/10/2022 [facility]   BREAST BIOPSY Right 10/10/2022   MM RT BREAST BX W LOC DEV 1ST LESION IMAGE BX SPEC STEREO GUIDE 10/10/2022 [facility]   BREAST BIOPSY Right 10/10/2022   MM RT BREAST BX W LOC DEV EA AD LESION IMG BX SPEC STEREO GUIDE 10/10/2022 [facility]   BREAST BIOPSY Right 10/16/2022   MM RT BREAST BX W LOC DEV 1ST LESION IMAGE BX SPEC STEREO GUIDE 10/16/2022 [facility]   COLONOSCOPY WITH PROPOFOL N/A 06/01/2012   Procedure: COLONOSCOPY WITH PROPOFOL;  Surgeon: Charolett Bumpers, MD;  Location: [facility];  Service: Endoscopy;  Laterality: N/A;   FACELIFT     KNEE ARTHROSCOPY  03/05/2007   Right   Nasal suirgery      FAMILY HISTORY:  Family History  Problem Relation Age of Onset   Heart attack Mother    Breast cancer Neg Hx     SOCIAL HISTORY:  Social History   Socioeconomic History   Marital status: Married    Spouse name: Not on file   Number of children: 2   Years of education: Not on file    Highest education level: Not on file  Occupational History   Not on file  Tobacco Use   Smoking status: Never   Smokeless tobacco: Never  Substance and Sexual Activity   Alcohol use: Yes    Comment: Occasional   Drug use: No   Sexual activity: Not on file  Other Topics Concern   Not on file  Social History Narrative   Not on file   Social Determinants of Health   Financial Resource Strain: Not on file  Food Insecurity: No Food Insecurity (10/30/2022)   Hunger Vital Sign    Worried About Running Out of Food in the Last Year: Never true    Ran Out of Food in the Last Year: Never true  Transportation Needs: No Transportation Needs (10/30/2022)   PRAPARE - Administrator, Civil Service (Medical): No    Lack of Transportation (Non-Medical): No  Physical Activity: Not on file  Stress: Not on file  Social Connections: Unknown (07/16/2021)   Received from [facility]   Social Network    Social Network: Not on file    ALLERGIES: No Known Allergies  MEDICATIONS:  Current Outpatient Medications  Medication Sig Dispense Refill   aspirin EC 81 MG tablet Take 81 mg by mouth daily. Swallow whole.     cholecalciferol (VITAMIN D) 1000 UNITS tablet Take 1,000 Units by mouth daily.     estradiol (VIVELLE-DOT) 0.05 MG/24HR patch Place 1 patch onto the skin 2 (two) times a week.     metoprolol succinate (TOPROL-XL) 25 MG 24 hr tablet Take 25 mg by mouth daily.     thyroid (ARMOUR) 30 MG tablet Take 30 mg by mouth daily before breakfast.     Vortioxetine HBr (BRINTELLIX PO) Take 10 mg by mouth daily.  2   No current facility-administered medications for this encounter.    REVIEW OF SYSTEMS: A 10+ POINT REVIEW OF SYSTEMS WAS OBTAINED including neurology, dermatology, psychiatry, cardiac, respiratory, lymph, extremities, GI, GU, musculoskeletal, constitutional, reproductive, HEENT. On the provided form, she reports weight changes, fatigue, wearing glasses (reading  glasses), sinus problems, irregular heartbeat, palpitations, SOB with walking and using stairs, a palpable breast lump, bruising easily, depression, thyroid problems, and hot flashes since stopping HRT. She denies any other symptoms.    PHYSICAL EXAM:     10/30/2022  Vitals with BMI   Height 5\' 8"    Weight 130 lbs 3 oz   BMI 19.8   Systolic 145 !   Diastolic 65 !   Pulse 100     Legend: ! Abnormal  Lungs are clear to auscultation bilaterally. Heart has regular rate and rhythm. No palpable cervical, supraclavicular, or axillary adenopathy. Abdomen soft, non-tender, normal bowel sounds. Breast: Left breast with no palpable mass, nipple discharge, or bleeding. Right breast with a palpable lump measuring 1 cm in the 6 o'clock right breast with some bruising associated with the biopsy site. No nipple discharge or bleeding appreciated.  KPS = 90  100 - Normal; no complaints; no evidence of disease. 90   - Able to carry on normal activity; minor signs or symptoms of disease. 80   - Normal activity with effort; some signs or symptoms of disease. 60   - Cares for self; unable to carry on normal activity or to do active work. 60   - Requires occasional assistance, but is able to care for most of his personal needs. 50   - Requires considerable assistance and frequent medical care. 40   - Disabled; requires special care and assistance. 30   - Severely disabled; hospital admission is indicated although death not imminent. 20   - Very sick; hospital admission necessary; active supportive treatment necessary. 10   - Moribund; fatal processes progressing rapidly. 0     - Dead  Karnofsky DA, Abelmann WH, Craver LS and Burchenal [facility] [phone]) The use of the nitrogen mustards in the palliative treatment of carcinoma: with particular reference to bronchogenic carcinoma Cancer 1 634-56  LABORATORY DATA:  Lab Results  Component Value Date   WBC 6.1 10/30/2022   HGB 13.4 10/30/2022   HCT 40.0  10/30/2022   MCV 94.1 10/30/2022   PLT 207 10/30/2022   Lab Results  Component Value Date   NA 141 10/30/2022   K 4.3 10/30/2022   CL 105 10/30/2022   CO2 31 10/30/2022   Lab Results  Component Value Date   ALT 12 10/30/2022   AST 18 10/30/2022   ALKPHOS 42 10/30/2022   BILITOT 0.6 10/30/2022    PULMONARY FUNCTION TEST:   Review Flowsheet       Latest Ref Rng & Units 02/19/2019  Spirometry  FVC-%Pred-Pre % 107   FEV1-Pre L 2.90   FEV1-%Pred-Pre % 106   DLCO unc ml/min/mmHg 20.68     Details            RADIOGRAPHY: MM RT BREAST BX W LOC DEV 1ST LESION IMAGE BX SPEC STEREO GUIDE  Addendum Date: 10/20/2022   ADDENDUM REPORT: 10/20/2022 21:15 ADDENDUM: Pathology revealed BENIGN BREAST TISSUES WITH ADENOSIS FIBROCYSTIC CHANGE AND CALCIFICATION of the RIGHT breast, medial, (x clip). This was found to be concordant by Dr. Annia Belt. Pathology results were discussed with the patient by telephone. The patient reported doing well after the biopsy with tenderness at the site. Post biopsy instructions and care were reviewed and questions were answered. The patient was encouraged to call [facility] for any additional concerns. My direct phone number was provided. The patient has a recent diagnosis of RIGHT breast cancer and was referred to [facility] on October 29, 2021, PM [facility], per patient request. Pathology results reported by Rene Kocher, RN on 10/17/2022. Electronically Signed   By: Annia Belt M.D.   On: 10/20/2022 21:15   Result Date: 10/20/2022 CLINICAL DATA:  Patient with indeterminate calcifications medial right breast. EXAM: RIGHT BREAST STEREOTACTIC CORE NEEDLE BIOPSY COMPARISON:  Previous exam(s). FINDINGS: The patient and I discussed the procedure of stereotactic-guided biopsy including benefits and alternatives. We discussed the high likelihood of a successful  procedure. We discussed the risks of the procedure including infection, bleeding, tissue injury, clip migration, and inadequate sampling. Informed written consent was given. The usual time out protocol was performed immediately prior to the procedure. Using sterile technique and 1% Lidocaine as local anesthetic, under stereotactic guidance, a 9 gauge vacuum assisted device was used to  perform core needle biopsy of calcifications medial right breast using a medial approach. Specimen radiograph was performed showing calcifications. Specimens with calcifications are identified for pathology. Lesion quadrant: Lower inner quadrant At the conclusion of the procedure, X shaped tissue marker clip was deployed into the biopsy cavity. Follow-up 2-view mammogram was performed and dictated separately. IMPRESSION: Stereotactic-guided biopsy of right breast calcifications. No apparent complications. Electronically Signed: By: Annia Belt M.D. On: 10/16/2022 12:53   MM CLIP PLACEMENT RIGHT  Result Date: 10/16/2022 CLINICAL DATA:  Status post stereo biopsy right breast calcifications. EXAM: 3D DIAGNOSTIC RIGHT MAMMOGRAM POST STEREOTACTIC BIOPSY COMPARISON:  Previous exam(s). FINDINGS: 3D Mammographic images were obtained following stereotactic guided biopsy of medial right breast calcifications. The biopsy marking clip is in expected position at the site of biopsy. IMPRESSION: Appropriate positioning of the X shaped biopsy marking clip at the site of biopsy in the medial right breast. Final Assessment: Post Procedure Mammograms for Marker Placement Electronically Signed   By: Annia Belt M.D.   On: 10/16/2022 13:02   [place] RT BREAST BX W LOC DEV 1ST LESION IMG BX SPEC US GUIDE  Addendum Date: 10/15/2022   ADDENDUM REPORT: 10/15/2022 15:20 ADDENDUM: Pathology revealed GRADE I INVASIVE DUCTAL CARCINOMA, DUCTAL CARCINOMA IN SITU, INTERMEDIATE GRADE, OTHER FINDINGS: INTRADUCTAL PAPILLOMA (2 MM), FLORID USUAL DUCTAL HYPERPLASIA of  the RIGHT breast, 6:00 o'clock, (heart clip). This was found to be concordant by Dr. Emmaline Kluver. Pathology revealed BENIGN BREAST TISSUE WITH FLORID ADENOSIS AND COLUMNAR CELL CHANGE, CALCIFICATIONS PRESENT of the RIGHT breast, upper outer posterior, (venus clip). This was found to be concordant by Dr. Emmaline Kluver. Pathology revealed BENIGN BREAST TISSUE WITH USUAL DUCTAL HYPERPLASIA, ADENOSIS AND COLUMNAR CELL CHANGE, CALCIFICATIONS PRESENT of the RIGHT breast, central, (ribbon clip). This was found to be concordant by Dr. Emmaline Kluver. Pathology results were discussed with the patient and her husband by telephone. The patient reported doing well after the biopsies with tenderness at the sites. Post biopsy instructions and care were reviewed and questions were answered. The patient was encouraged to call [facility] for any additional concerns. My direct phone number was provided. The patient is scheduled for a RIGHT breast stereotactic guided biopsy of the more medial calcifications on October 16, 2022. Further recommendations will be guided by the results of this biopsy. Surgical consultation has been arranged with Dr. [name] at [facility] on October 25, 2022. Recommendation for a bilateral breast MRI, per previous diagnostic report. Pathology results reported by Rene Kocher, RN on 10/15/2022. Electronically Signed   By: Emmaline Kluver M.D.   On: 10/15/2022 15:20   Result Date: 10/15/2022 CLINICAL DATA:  72 yo female presenting for biopsy of mass in the right breast. EXAM: ULTRASOUND GUIDED RIGHT BREAST CORE NEEDLE BIOPSY COMPARISON:  Previous exam(s). PROCEDURE: I met with the patient and we discussed the procedure of ultrasound-guided biopsy, including benefits and alternatives. We discussed the high likelihood of a successful procedure. We discussed the risks of the procedure, including infection, bleeding, tissue injury, clip migration,  and inadequate sampling. Informed written consent was given. The usual time-out protocol was performed immediately prior to the procedure. Lesion quadrant: Lower inner quadrant Using sterile technique and 1% Lidocaine as local anesthetic, under direct ultrasound visualization, a 14 gauge spring-loaded device was used to perform biopsy of a mass in the right breast at 6:00 using a lateral approach. At the conclusion of the procedure a heart shaped tissue marker clip was deployed  into the biopsy cavity. Follow up 2 view mammogram was performed and dictated separately. IMPRESSION: Ultrasound guided biopsy of a mass in the right breast at 6:00. No apparent complications. Electronically Signed: By: Emmaline Kluver M.D. On: 10/10/2022 12:34   MM RT BREAST BX W LOC DEV 1ST LESION IMAGE BX SPEC STEREO GUIDE  Addendum Date: 10/15/2022   ADDENDUM REPORT: 10/15/2022 15:20 ADDENDUM: Pathology revealed GRADE I INVASIVE DUCTAL CARCINOMA, DUCTAL CARCINOMA IN SITU, INTERMEDIATE GRADE, OTHER FINDINGS: INTRADUCTAL PAPILLOMA (2 MM), FLORID USUAL DUCTAL HYPERPLASIA of the RIGHT breast, 6:00 o'clock, (heart clip). This was found to be concordant by Dr. Emmaline Kluver. Pathology revealed BENIGN BREAST TISSUE WITH FLORID ADENOSIS AND COLUMNAR CELL CHANGE, CALCIFICATIONS PRESENT of the RIGHT breast, upper outer posterior, (venus clip). This was found to be concordant by Dr. Emmaline Kluver. Pathology revealed BENIGN BREAST TISSUE WITH USUAL DUCTAL HYPERPLASIA, ADENOSIS AND COLUMNAR CELL CHANGE, CALCIFICATIONS PRESENT of the RIGHT breast, central, (ribbon clip). This was found to be concordant by Dr. Emmaline Kluver. Pathology results were discussed with the patient and her husband by telephone. The patient reported doing well after the biopsies with tenderness at the sites. Post biopsy instructions and care were reviewed and questions were answered. The patient was encouraged to call [facility] for  any additional concerns. My direct phone number was provided. The patient is scheduled for a RIGHT breast stereotactic guided biopsy of the more medial calcifications on October 16, 2022. Further recommendations will be guided by the results of this biopsy. Surgical consultation has been arranged with Dr. [name] at [facility] on October 25, 2022. Recommendation for a bilateral breast MRI, per previous diagnostic report. Pathology results reported by Rene Kocher, RN on 10/15/2022. Electronically Signed   By: Emmaline Kluver M.D.   On: 10/15/2022 15:20   Addendum Date: 10/11/2022   ADDENDUM REPORT: 10/11/2022 13:17 ADDENDUM: Patient presented for evaluation on 10/11/2022 the day after biopsy for a rash in the medial aspect of the right breast. No significant pain. Patient denies fever. On physical exam there is a small patch of erythema along the medial aspect of the right breast. No raised lesion or open wound. Suspect possible contact dermatitis from compression against the mammogram paddle or possibly from icing. Infection seems less likely. Patient was given my direct cell phone number and an antibiotic prescription in case symptoms worsen over the weekend. Our office will follow-up with her when she is contacted with biospy results. Electronically Signed   By: Emmaline Kluver M.D.   On: 10/11/2022 13:17   Result Date: 10/15/2022 CLINICAL DATA:  72yo female presenting for biospy of two sites of calcifications in the right breast. EXAM: RIGHT BREAST STEREOTACTIC CORE NEEDLE BIOPSY x 2 COMPARISON:  Previous exam(s). FINDINGS: The patient and I discussed the procedure of stereotactic-guided biopsy including benefits and alternatives. We discussed the high likelihood of a successful procedure. We discussed the risks of the procedure including infection, bleeding, tissue injury, clip migration, and inadequate sampling. Informed written consent was given. The usual time out protocol was  performed immediately prior to the procedure. Using sterile technique and 1% Lidocaine as local anesthetic, under stereotactic guidance, a 9 gauge vacuum assisted device was used to perform core needle biopsy of calcifications in the upper outer right breast using a superior approach. Specimen radiograph was performed showing several specimens with calcifications. Specimens with calcifications are identified for pathology. Lesion quadrant: Upper outer quadrant At the conclusion of the procedure, a  Venus shaped tissue marker clip was deployed into the biopsy cavity. Follow-up 2-view mammogram was performed and dictated separately. Using sterile technique and 1% Lidocaine as local anesthetic, under stereotactic guidance, a 9 gauge vacuum assisted device was used to perform core needle biopsy of calcifications in the central right breast using a superior approach. Specimen radiograph was performed showing several specimens with calcifications. Specimens with calcifications are identified for pathology. Lesion quadrant: Upper outer quadrant At the conclusion of the procedure, a ribbon shaped tissue marker clip was deployed into the biopsy cavity. Follow-up 2-view mammogram was performed and dictated separately. IMPRESSION: Stereotactic-guided biopsy of calcifications in the upper outer and central right breast. No apparent complications. Electronically Signed: By: Emmaline Kluver M.D. On: 10/10/2022 12:38   MM RT BREAST BX W LOC DEV EA AD LESION IMG BX SPEC STEREO GUIDE  Addendum Date: 10/15/2022   ADDENDUM REPORT: 10/15/2022 15:20 ADDENDUM: Pathology revealed GRADE I INVASIVE DUCTAL CARCINOMA, DUCTAL CARCINOMA IN SITU, INTERMEDIATE GRADE, OTHER FINDINGS: INTRADUCTAL PAPILLOMA (2 MM), FLORID USUAL DUCTAL HYPERPLASIA of the RIGHT breast, 6:00 o'clock, (heart clip). This was found to be concordant by Dr. Emmaline Kluver. Pathology revealed BENIGN BREAST TISSUE WITH FLORID ADENOSIS AND COLUMNAR CELL CHANGE,  CALCIFICATIONS PRESENT of the RIGHT breast, upper outer posterior, (venus clip). This was found to be concordant by Dr. Emmaline Kluver. Pathology revealed BENIGN BREAST TISSUE WITH USUAL DUCTAL HYPERPLASIA, ADENOSIS AND COLUMNAR CELL CHANGE, CALCIFICATIONS PRESENT of the RIGHT breast, central, (ribbon clip). This was found to be concordant by Dr. Emmaline Kluver. Pathology results were discussed with the patient and her husband by telephone. The patient reported doing well after the biopsies with tenderness at the sites. Post biopsy instructions and care were reviewed and questions were answered. The patient was encouraged to call [facility] for any additional concerns. My direct phone number was provided. The patient is scheduled for a RIGHT breast stereotactic guided biopsy of the more medial calcifications on October 16, 2022. Further recommendations will be guided by the results of this biopsy. Surgical consultation has been arranged with Dr. [name] at [facility] on October 25, 2022. Recommendation for a bilateral breast MRI, per previous diagnostic report. Pathology results reported by Rene Kocher, RN on 10/15/2022. Electronically Signed   By: Emmaline Kluver M.D.   On: 10/15/2022 15:20   Addendum Date: 10/11/2022   ADDENDUM REPORT: 10/11/2022 13:17 ADDENDUM: Patient presented for evaluation on 10/11/2022 the day after biopsy for a rash in the medial aspect of the right breast. No significant pain. Patient denies fever. On physical exam there is a small patch of erythema along the medial aspect of the right breast. No raised lesion or open wound. Suspect possible contact dermatitis from compression against the mammogram paddle or possibly from icing. Infection seems less likely. Patient was given my direct cell phone number and an antibiotic prescription in case symptoms worsen over the weekend. Our office will follow-up with her when she is contacted  with biospy results. Electronically Signed   By: Emmaline Kluver M.D.   On: 10/11/2022 13:17   Result Date: 10/15/2022 CLINICAL DATA:  72yo female presenting for biospy of two sites of calcifications in the right breast. EXAM: RIGHT BREAST STEREOTACTIC CORE NEEDLE BIOPSY x 2 COMPARISON:  Previous exam(s). FINDINGS: The patient and I discussed the procedure of stereotactic-guided biopsy including benefits and alternatives. We discussed the high likelihood of a successful procedure. We discussed the risks of the procedure including infection, bleeding,  tissue injury, clip migration, and inadequate sampling. Informed written consent was given. The usual time out protocol was performed immediately prior to the procedure. Using sterile technique and 1% Lidocaine as local anesthetic, under stereotactic guidance, a 9 gauge vacuum assisted device was used to perform core needle biopsy of calcifications in the upper outer right breast using a superior approach. Specimen radiograph was performed showing several specimens with calcifications. Specimens with calcifications are identified for pathology. Lesion quadrant: Upper outer quadrant At the conclusion of the procedure, a Venus shaped tissue marker clip was deployed into the biopsy cavity. Follow-up 2-view mammogram was performed and dictated separately. Using sterile technique and 1% Lidocaine as local anesthetic, under stereotactic guidance, a 9 gauge vacuum assisted device was used to perform core needle biopsy of calcifications in the central right breast using a superior approach. Specimen radiograph was performed showing several specimens with calcifications. Specimens with calcifications are identified for pathology. Lesion quadrant: Upper outer quadrant At the conclusion of the procedure, a ribbon shaped tissue marker clip was deployed into the biopsy cavity. Follow-up 2-view mammogram was performed and dictated separately. IMPRESSION: Stereotactic-guided  biopsy of calcifications in the upper outer and central right breast. No apparent complications. Electronically Signed: By: Emmaline Kluver M.D. On: 10/10/2022 12:38   MM CLIP PLACEMENT RIGHT  Result Date: 10/10/2022 CLINICAL DATA:  Post procedure mammogram for clip placement EXAM: 3D DIAGNOSTIC RIGHT MAMMOGRAM POST ULTRASOUND AND STEREOTACTIC BIOPSY COMPARISON:  Previous exam(s). FINDINGS: 3D Mammographic images were obtained following ultrasound guided biopsy of a mass in the right breast at 6:00. The heart biopsy marking clip is in expected position at the site of biopsy. 3D Mammographic images were obtained following stereotactic guided biopsy of calcifications in the upper outer right breast. The Venus biopsy marking clip is in expected position at the site of biopsy. 3D Mammographic images were obtained following stereotactic guided biopsy of calcifications in the central right breast. The ribbon biopsy marking clip is in expected position at the site of biopsy. IMPRESSION: Appropriate positioning of the heart shaped biopsy marking clip at the site of biopsy in the right breast at 6:00. Appropriate positioning of the Venus shaped biopsy marking clip at the site of biopsy in the upper outer right breast. Appropriate positioning of the ribbon shaped biopsy marking clip at the site of biopsy in the central right breast. Final Assessment: Post Procedure Mammograms for Marker Placement Electronically Signed   By: Emmaline Kluver M.D.   On: 10/10/2022 12:47   MM 3D DIAGNOSTIC MAMMOGRAM UNILATERAL RIGHT BREAST  Result Date: 10/03/2022 CLINICAL DATA:  Patient presents for palpable mass within the right breast. EXAM: DIGITAL DIAGNOSTIC UNILATERAL RIGHT MAMMOGRAM WITH TOMOSYNTHESIS AND CAD; ULTRASOUND RIGHT BREAST LIMITED TECHNIQUE: Right digital diagnostic mammography and breast tomosynthesis was performed. The images were evaluated with computer-aided detection. ; Targeted ultrasound examination of the right  breast was performed COMPARISON:  Previous exam(s). ACR Breast Density Category c: The breasts are heterogeneously dense, which may obscure small masses. FINDINGS: Magnification views of the right breast were obtained. Within the upper-outer right breast posterior depth there is a 9 mm group of coarse heterogeneous calcifications. Within the central slightly superior right breast there is a 1 cm group of amorphous calcifications. Within the far medial slightly posterior right breast adjacent to the coil clip there is a 1.2 cm group of likely early dystrophic calcifications. No definite abnormality underlying the palpable marker within the right breast. On physical exam, there has a small mobile mass underlying  the palpable area of concern inferior right breast. Targeted ultrasound is performed, showing a 1.0 x 0.8 x 0.8 cm irregular hypoechoic mass right breast 6 o'clock position 5 cm from the nipple at the site of palpable concern. No right axillary adenopathy. IMPRESSION: 1. Suspicious palpable mass right breast 6 o'clock position. 2. Interval development of multiple areas of calcifications within the right breast as detailed above. RECOMMENDATION: 1. Ultrasound-guided core needle biopsy palpable right breast mass 6 o'clock position. 2. Stereotactic guided core needle biopsy of the coarse heterogeneous calcifications upper-outer right breast posterior depth. 3. Stereotactic guided core needle biopsy of the central slightly superior amorphous calcifications right breast. 4. If these are all benign in etiology, recommend a follow-up in 6 months of the medial calcifications. If any of the pathology is malignant, a stereo biopsy of the more medial calcifications is recommended. 5. If there is any malignant pathology, recommend bilateral breast MRI. I have discussed the findings and recommendations with the patient. If applicable, a reminder letter will be sent to the patient regarding the next appointment. BI-RADS  CATEGORY  4: Suspicious. Electronically Signed   By: Annia Belt M.D.   On: 10/03/2022 12:36   [place] LIMITED ULTRASOUND INCLUDING AXILLA RIGHT BREAST  Result Date: 10/03/2022 CLINICAL DATA:  Patient presents for palpable mass within the right breast. EXAM: DIGITAL DIAGNOSTIC UNILATERAL RIGHT MAMMOGRAM WITH TOMOSYNTHESIS AND CAD; ULTRASOUND RIGHT BREAST LIMITED TECHNIQUE: Right digital diagnostic mammography and breast tomosynthesis was performed. The images were evaluated with computer-aided detection. ; Targeted ultrasound examination of the right breast was performed COMPARISON:  Previous exam(s). ACR Breast Density Category c: The breasts are heterogeneously dense, which may obscure small masses. FINDINGS: Magnification views of the right breast were obtained. Within the upper-outer right breast posterior depth there is a 9 mm group of coarse heterogeneous calcifications. Within the central slightly superior right breast there is a 1 cm group of amorphous calcifications. Within the far medial slightly posterior right breast adjacent to the coil clip there is a 1.2 cm group of likely early dystrophic calcifications. No definite abnormality underlying the palpable marker within the right breast. On physical exam, there has a small mobile mass underlying the palpable area of concern inferior right breast. Targeted ultrasound is performed, showing a 1.0 x 0.8 x 0.8 cm irregular hypoechoic mass right breast 6 o'clock position 5 cm from the nipple at the site of palpable concern. No right axillary adenopathy. IMPRESSION: 1. Suspicious palpable mass right breast 6 o'clock position. 2. Interval development of multiple areas of calcifications within the right breast as detailed above. RECOMMENDATION: 1. Ultrasound-guided core needle biopsy palpable right breast mass 6 o'clock position. 2. Stereotactic guided core needle biopsy of the coarse heterogeneous calcifications upper-outer right breast posterior depth. 3.  Stereotactic guided core needle biopsy of the central slightly superior amorphous calcifications right breast. 4. If these are all benign in etiology, recommend a follow-up in 6 months of the medial calcifications. If any of the pathology is malignant, a stereo biopsy of the more medial calcifications is recommended. 5. If there is any malignant pathology, recommend bilateral breast MRI. I have discussed the findings and recommendations with the patient. If applicable, a reminder letter will be sent to the patient regarding the next appointment. BI-RADS CATEGORY  4: Suspicious. Electronically Signed   By: Annia Belt M.D.   On: 10/03/2022 12:36      IMPRESSION: Stage IA (cT1b, cN0, cM0) Right Breast LOQ, Invasive ductal carcinoma with intermediate grade DCIS, ER+ /  PR+ / Her2-, Grade 1  She would be a good candidate for breast conservation with radiotherapy to the right breast. We discussed the general course of radiation, potential side effects, and toxicities with radiation and the patient is interested in this approach.  Depending on final pathologic results she may be able to safely omit radiation therapy assuming she proceeds with adjuvant hormonal therapy.   PLAN:  Genetics  Right lumpectomy  Oncotype Dx  +/- adjuvant radiation therapy  +/- hormonal therapy   (She will also have a thyroid ultrasound performed)   ------------------------------------------------  Billie Lade, PhD, MD  This document serves as a record of services personally performed by Antony Blackbird, MD. It was created on his behalf by Neena Rhymes, a trained medical scribe. The creation of this record is based on the scribe's personal observations and the provider's statements to them. This document has been checked and approved by the attending provider.

## 2022-10-31 ENCOUNTER — Encounter: Payer: Self-pay | Admitting: *Deleted

## 2022-10-31 ENCOUNTER — Telehealth: Payer: Self-pay | Admitting: Genetic Counselor

## 2022-10-31 ENCOUNTER — Telehealth: Payer: Self-pay | Admitting: *Deleted

## 2022-10-31 ENCOUNTER — Ambulatory Visit (HOSPITAL_COMMUNITY)
Admission: RE | Admit: 2022-10-31 | Discharge: 2022-10-31 | Disposition: A | Discharge status: Home / Self Care | Payer: 59 | Source: Ambulatory Visit | Attending: Hematology and Oncology | Admitting: Hematology and Oncology

## 2022-10-31 DIAGNOSIS — E041 Nontoxic single thyroid nodule: Secondary | ICD-10-CM | POA: Insufficient documentation

## 2022-10-31 NOTE — Telephone Encounter (Signed)
Spoke to pt regarding [facility] from 8.29.24. Denies questions or concerns regarding dx or treatment care plan.  Discussed thyroid US results. Encourage pt to call with needs. Received verbal understanding.

## 2022-10-31 NOTE — Telephone Encounter (Signed)
Ms. Lietzke was seen by a genetic counselor during the breast cancer multidisciplinary clinic on 10/30/2022. She reported no family history of cancer. She does not meet NCCN criteria for genetic testing at this time. She was still offered genetic counseling and testing but declined. We encourage her to contact us if there are any changes to her personal or family history of cancer. If she meets NCCN criteria based on the updated personal/family history, she would be recommended to have genetic counseling and testing.   Lalla Brothers, MS, [facility] Genetic Counselor [email or web address] (P) [phone]

## 2022-10-31 NOTE — Telephone Encounter (Signed)
[employer] 2021-05 - Specimen Collection Study to Evaluate Biomarkers in Subjects with Cancer    Patient Crystal Galvan was identified by Dr. Pamelia Hoit as a potential candidate for the above listed study.  This Clinical Research Nurse  called and spoke to Crystal Galvan, [id number], to discuss participation in the above listed research study. A copy of the informed consent document with embedded HIPAA language was emailed to the patient.  Patient reads, speaks, and understands [place].    The patient was encouraged to contact the research team with any questions.  Approximately 10 minutes was spent with the patient on the phone reviewing the informed consent documents. The pt was informed that her study participation is completely voluntary.  The pt was told that her blood would have to be drawn before she begins any treatment or has surgery.  The pt agreed to read over the consent form.  The nurse will contact the pt in 1 week to answer any questions about the study.  The pt was thanked for her time and consideration of this specimen study. Janan Ridge RN, BSN, CCRP Clinical Research Nurse Lead 10/31/2022 4:07 PM

## 2022-11-01 ENCOUNTER — Encounter (HOSPITAL_BASED_OUTPATIENT_CLINIC_OR_DEPARTMENT_OTHER): Payer: Self-pay | Admitting: General Surgery

## 2022-11-01 ENCOUNTER — Other Ambulatory Visit: Payer: Self-pay

## 2022-11-06 ENCOUNTER — Telehealth: Payer: Self-pay | Admitting: *Deleted

## 2022-11-06 DIAGNOSIS — S40862A Insect bite (nonvenomous) of left upper arm, initial encounter: Secondary | ICD-10-CM | POA: Diagnosis not present

## 2022-11-06 DIAGNOSIS — Z85828 Personal history of other malignant neoplasm of skin: Secondary | ICD-10-CM | POA: Diagnosis not present

## 2022-11-06 NOTE — Telephone Encounter (Signed)
[employer] 2021-05 - Specimen Collection Study to Evaluate Biomarkers in Subjects with Cancer    Research nurse called pt to see if she had any questions about the above study.  The pt said that she got the consent form emailed to her, but she was not sure if she had read the consent form yet.  The pt's surgery is scheduled for 11/11/22.  The pt said that she has developed shingles and has several appointments to coordinate before her surgery next Monday.  The pt declined participation due to "running out of time" before her surgery.  The pt was thanked for her time and consideration of this study.  Janan Ridge RN, BSN, CCRP Clinical Research Nurse Lead 11/06/2022 11:41 AM

## 2022-11-07 DIAGNOSIS — E538 Deficiency of other specified B group vitamins: Secondary | ICD-10-CM | POA: Diagnosis not present

## 2022-11-07 DIAGNOSIS — E559 Vitamin D deficiency, unspecified: Secondary | ICD-10-CM | POA: Diagnosis not present

## 2022-11-07 DIAGNOSIS — Z131 Encounter for screening for diabetes mellitus: Secondary | ICD-10-CM | POA: Diagnosis not present

## 2022-11-07 DIAGNOSIS — J329 Chronic sinusitis, unspecified: Secondary | ICD-10-CM | POA: Diagnosis not present

## 2022-11-07 DIAGNOSIS — L659 Nonscarring hair loss, unspecified: Secondary | ICD-10-CM | POA: Diagnosis not present

## 2022-11-07 DIAGNOSIS — J069 Acute upper respiratory infection, unspecified: Secondary | ICD-10-CM | POA: Diagnosis not present

## 2022-11-07 DIAGNOSIS — C50911 Malignant neoplasm of unspecified site of right female breast: Secondary | ICD-10-CM | POA: Diagnosis not present

## 2022-11-07 DIAGNOSIS — K5900 Constipation, unspecified: Secondary | ICD-10-CM | POA: Diagnosis not present

## 2022-11-07 DIAGNOSIS — E039 Hypothyroidism, unspecified: Secondary | ICD-10-CM | POA: Diagnosis not present

## 2022-11-07 DIAGNOSIS — E2749 Other adrenocortical insufficiency: Secondary | ICD-10-CM | POA: Diagnosis not present

## 2022-11-07 DIAGNOSIS — I2510 Atherosclerotic heart disease of native coronary artery without angina pectoris: Secondary | ICD-10-CM | POA: Diagnosis not present

## 2022-11-07 DIAGNOSIS — C50919 Malignant neoplasm of unspecified site of unspecified female breast: Secondary | ICD-10-CM | POA: Diagnosis not present

## 2022-11-07 DIAGNOSIS — E7210 Disorders of sulfur-bearing amino-acid metabolism, unspecified: Secondary | ICD-10-CM | POA: Diagnosis not present

## 2022-11-08 ENCOUNTER — Other Ambulatory Visit: Payer: Self-pay

## 2022-11-08 ENCOUNTER — Encounter (HOSPITAL_COMMUNITY): Payer: Self-pay | Admitting: General Surgery

## 2022-11-08 NOTE — Anesthesia Preprocedure Evaluation (Signed)
Anesthesia Evaluation  Patient identified by MRN, date of birth, ID band Patient awake    Reviewed: Allergy & Precautions, H&P , NPO status , Patient's Chart, lab work & pertinent test results  Airway Mallampati: II  TM Distance: >3 FB Neck ROM: Full    Dental no notable dental hx.    Pulmonary neg pulmonary ROS   Pulmonary exam normal breath sounds clear to auscultation       Cardiovascular + CAD  Normal cardiovascular exam Rhythm:Regular Rate:Normal     Neuro/Psych  Headaches   Depression     negative psych ROS   GI/Hepatic negative GI ROS, Neg liver ROS,,,  Endo/Other  Hypothyroidism    Renal/GU negative Renal ROS  negative genitourinary   Musculoskeletal negative musculoskeletal ROS (+)    Abdominal   Peds negative pediatric ROS (+)  Hematology negative hematology ROS (+)   Anesthesia Other Findings Breast Mass  Reproductive/Obstetrics negative OB ROS                             Anesthesia Physical Anesthesia Plan  ASA: 3  Anesthesia Plan: General   Post-op Pain Management:    Induction: Intravenous  PONV Risk Score and Plan: 3 and Ondansetron, Dexamethasone, Midazolam and Treatment may vary due to age or medical condition  Airway Management Planned: LMA  Additional Equipment:   Intra-op Plan:   Post-operative Plan: Extubation in OR  Informed Consent: I have reviewed the patients History and Physical, chart, labs and discussed the procedure including the risks, benefits and alternatives for the proposed anesthesia with the patient or authorized representative who has indicated his/her understanding and acceptance.     Dental advisory given  Plan Discussed with: CRNA  Anesthesia Plan Comments: (Patient currently has lesions on left shoulder, seen by dermatology on 11/06/2022 with differential insect bite versus poison ivy versus possible herpes zoster.  She was  prescribed triamcinolone 0.1% cream as well as Valtrex to take 3 times daily for 7 days.)        Anesthesia Quick Evaluation

## 2022-11-08 NOTE — Progress Notes (Signed)
Called patient back to make sure she takes her Valtrex everyday as it is prescribed. She stated that she will take it.

## 2022-11-08 NOTE — Progress Notes (Signed)
SDW CALL  Patient was given pre-op instructions over the phone. The opportunity was given for the patient to ask questions. No further questions asked. Patient verbalized understanding of instructions given.   PCP - Buren Kos, MD Cardiologist - Ripley Fraise  PPM/ICD - denies Device Orders -  Rep Notified -   Chest x-ray - na EKG - 07/10/22 Stress Test -none ECHO - 05/17/21 Cardiac Cath - none  Sleep Study - denies CPAP - no  Fasting Blood Sugar - na Checks Blood Sugar _____ times a day  Blood Thinner Instructions:na Aspirin Instructions:pt states LD was 11/01/22  ERAS Protcol -clear liquids until 0815 PRE-SURGERY Ensure or G2- no  COVID TEST- na   Anesthesia review: yes - CAD; surgery moved from outpatient surgery center and postponed because patient had potential shingles. She has history of shingles. She was seen by dermatologist who thought she had an insect bite or poison ivy,not shingles. Over the phone, pt reports that the rash/lesion is not weeping. She says it is getting better,itching some.   Patient denies shortness of breath, fever, cough and chest pain over the phone call    Surgical Instructions    Your procedure is scheduled on September 9.  Report to [facility] Main Entrance "A" at 8:45 A.M., then check in with the Admitting office.  Call this number if you have problems the morning of surgery:  [phone]    Remember:  Do not eat after midnight the night before your surgery  You may drink clear liquids until 8:15am the morning of your surgery.   Clear liquids allowed are: Water, Non-Citrus Juices (without pulp), Carbonated Beverages, Clear Tea, Black Coffee ONLY (NO MILK, CREAM OR POWDERED CREAMER of any kind), and Gatorade   Take these medicines the morning of surgery with A SIP OF WATER: Toprol XL, Armour Thyroid, Valtrex   As of today, STOP taking any Etodolac(LODINE), Aspirin (unless otherwise instructed by your surgeon) Aleve,  Naproxen, Ibuprofen, Motrin, Advil, Goody's, BC's, all herbal medications, fish oil, and all vitamins.  [facility] is not responsible for any belongings or valuables. .   Do NOT Smoke (Tobacco/Vaping)  24 hours prior to your procedure  If you use a CPAP at night, you may bring your mask for your overnight stay.   Contacts, glasses, hearing aids, dentures or partials may not be worn into surgery, please bring cases for these belongings   Patients discharged the day of surgery will not be allowed to drive home, and someone needs to stay with them for 24 hours.   Special instructions:    Oral Hygiene is also important to reduce your risk of infection.  Remember - BRUSH YOUR TEETH THE MORNING OF SURGERY WITH YOUR REGULAR TOOTHPASTE   Day of Surgery:  Take a shower the day of or night before with antibacterial soap. Wear Clean/Comfortable clothing the morning of surgery Do not apply any deodorants/lotions.   Do not wear jewelry or makeup Do not wear lotions, powders, perfumes/colognes, or deodorant. Do not shave 48 hours prior to surgery.  Men may shave face and neck. Do not bring valuables to the hospital. Do not wear nail polish, gel polish, artificial nails, or any other type of covering on natural nails (fingers and toes) If you have artificial nails or gel coating that need to be removed by a nail salon, please have this removed prior to surgery. Artificial nails or gel coating may interfere with anesthesia's ability to adequately monitor your vital signs. Remember to  brush your teeth WITH YOUR REGULAR TOOTHPASTE.

## 2022-11-11 ENCOUNTER — Other Ambulatory Visit: Payer: Self-pay

## 2022-11-11 ENCOUNTER — Encounter (HOSPITAL_COMMUNITY): Payer: Self-pay | Admitting: General Surgery

## 2022-11-11 ENCOUNTER — Encounter (HOSPITAL_COMMUNITY)
Admission: RE | Disposition: A | Discharge status: Home / Self Care | Payer: Self-pay | Source: Home / Self Care | Attending: General Surgery

## 2022-11-11 ENCOUNTER — Ambulatory Visit (HOSPITAL_BASED_OUTPATIENT_CLINIC_OR_DEPARTMENT_OTHER): Payer: 59 | Admitting: Physician Assistant

## 2022-11-11 ENCOUNTER — Ambulatory Visit (HOSPITAL_COMMUNITY): Payer: 59 | Admitting: Physician Assistant

## 2022-11-11 ENCOUNTER — Ambulatory Visit (HOSPITAL_COMMUNITY)
Admission: RE | Admit: 2022-11-11 | Discharge: 2022-11-11 | Disposition: A | Discharge status: Home / Self Care | Payer: 59 | Attending: General Surgery | Admitting: General Surgery

## 2022-11-11 DIAGNOSIS — C50511 Malignant neoplasm of lower-outer quadrant of right female breast: Secondary | ICD-10-CM | POA: Insufficient documentation

## 2022-11-11 DIAGNOSIS — E039 Hypothyroidism, unspecified: Secondary | ICD-10-CM

## 2022-11-11 DIAGNOSIS — Z170 Estrogen receptor positive status [ER+]: Secondary | ICD-10-CM | POA: Diagnosis not present

## 2022-11-11 DIAGNOSIS — C50911 Malignant neoplasm of unspecified site of right female breast: Secondary | ICD-10-CM

## 2022-11-11 DIAGNOSIS — I2510 Atherosclerotic heart disease of native coronary artery without angina pectoris: Secondary | ICD-10-CM | POA: Diagnosis not present

## 2022-11-11 DIAGNOSIS — Z7984 Long term (current) use of oral hypoglycemic drugs: Secondary | ICD-10-CM | POA: Insufficient documentation

## 2022-11-11 HISTORY — PX: BREAST LUMPECTOMY: SHX2

## 2022-11-11 SURGERY — BREAST LUMPECTOMY
Anesthesia: General | Site: Breast | Laterality: Right

## 2022-11-11 MED ORDER — MIDAZOLAM HCL 2 MG/2ML IJ SOLN
INTRAMUSCULAR | Status: AC
  Filled 2022-11-11: qty 2

## 2022-11-11 MED ORDER — HYDROMORPHONE HCL 1 MG/ML IJ SOLN: 0.2500 mg | INTRAMUSCULAR | Status: DC | PRN

## 2022-11-11 MED ORDER — PROPOFOL 10 MG/ML IV BOLUS
INTRAVENOUS | Status: DC | PRN
  Administered 2022-11-11: 150 mg via INTRAVENOUS
  Administered 2022-11-11: 50 mg via INTRAVENOUS

## 2022-11-11 MED ORDER — SODIUM CHLORIDE 0.9% FLUSH: 3.0000 mL | INTRAVENOUS | Status: DC | PRN

## 2022-11-11 MED ORDER — ONDANSETRON HCL 4 MG/2ML IJ SOLN
INTRAMUSCULAR | Status: DC | PRN
  Administered 2022-11-11: 4 mg via INTRAVENOUS

## 2022-11-11 MED ORDER — LACTATED RINGERS IV SOLN: INTRAVENOUS | Status: DC

## 2022-11-11 MED ORDER — PHENYLEPHRINE 80 MCG/ML (10ML) SYRINGE FOR IV PUSH (FOR BLOOD PRESSURE SUPPORT)
PREFILLED_SYRINGE | INTRAVENOUS | Status: DC | PRN
  Administered 2022-11-11: 120 ug via INTRAVENOUS
  Administered 2022-11-11 (×2): 80 ug via INTRAVENOUS

## 2022-11-11 MED ORDER — SODIUM CHLORIDE 0.9% FLUSH: 3.0000 mL | Freq: Two times a day (BID) | INTRAVENOUS | Status: DC

## 2022-11-11 MED ORDER — PROMETHAZINE HCL 25 MG/ML IJ SOLN: 6.2500 mg | INTRAMUSCULAR | Status: DC | PRN

## 2022-11-11 MED ORDER — ENSURE PRE-SURGERY PO LIQD: 296.0000 mL | Freq: Once | ORAL | Status: DC

## 2022-11-11 MED ORDER — OXYCODONE HCL 5 MG PO TABS: 5.0000 mg | ORAL_TABLET | Freq: Once | ORAL | Status: DC | PRN

## 2022-11-11 MED ORDER — OXYCODONE HCL 5 MG PO TABS: 5.0000 mg | ORAL_TABLET | ORAL | Status: DC | PRN

## 2022-11-11 MED ORDER — PHENYLEPHRINE HCL-NACL 20-0.9 MG/250ML-% IV SOLN
INTRAVENOUS | Status: DC | PRN
  Administered 2022-11-11: 30 ug/min via INTRAVENOUS

## 2022-11-11 MED ORDER — FENTANYL CITRATE (PF) 250 MCG/5ML IJ SOLN
INTRAMUSCULAR | Status: DC | PRN
  Administered 2022-11-11: 25 ug via INTRAVENOUS

## 2022-11-11 MED ORDER — 0.9 % SODIUM CHLORIDE (POUR BTL) OPTIME
TOPICAL | Status: DC | PRN
  Administered 2022-11-11: 1000 mL

## 2022-11-11 MED ORDER — AMISULPRIDE (ANTIEMETIC) 5 MG/2ML IV SOLN: 10.0000 mg | Freq: Once | INTRAVENOUS | Status: DC | PRN

## 2022-11-11 MED ORDER — OXYCODONE HCL 5 MG/5ML PO SOLN: 5.0000 mg | Freq: Once | ORAL | Status: DC | PRN

## 2022-11-11 MED ORDER — CHLORHEXIDINE GLUCONATE 0.12 % MT SOLN
15.0000 mL | Freq: Once | OROMUCOSAL | Status: AC
  Administered 2022-11-11: 15 mL via OROMUCOSAL
  Filled 2022-11-11: qty 15

## 2022-11-11 MED ORDER — BUPIVACAINE-EPINEPHRINE 0.25% -1:200000 IJ SOLN
INTRAMUSCULAR | Status: DC | PRN
  Administered 2022-11-11: 10 mL

## 2022-11-11 MED ORDER — FENTANYL CITRATE (PF) 250 MCG/5ML IJ SOLN
INTRAMUSCULAR | Status: AC
  Filled 2022-11-11: qty 5

## 2022-11-11 MED ORDER — CEFAZOLIN SODIUM-DEXTROSE 2-4 GM/100ML-% IV SOLN
2.0000 g | INTRAVENOUS | Status: AC
  Administered 2022-11-11: 2 g via INTRAVENOUS
  Filled 2022-11-11: qty 100

## 2022-11-11 MED ORDER — CHLORHEXIDINE GLUCONATE CLOTH 2 % EX PADS: 6.0000 | MEDICATED_PAD | Freq: Once | CUTANEOUS | Status: DC

## 2022-11-11 MED ORDER — LIDOCAINE 2% (20 MG/ML) 5 ML SYRINGE
INTRAMUSCULAR | Status: DC | PRN
  Administered 2022-11-11: 60 mg via INTRAVENOUS

## 2022-11-11 MED ORDER — ACETAMINOPHEN 325 MG PO TABS: 650.0000 mg | ORAL_TABLET | ORAL | Status: DC | PRN

## 2022-11-11 MED ORDER — ACETAMINOPHEN 650 MG RE SUPP: 650.0000 mg | RECTAL | Status: DC | PRN

## 2022-11-11 MED ORDER — BUPIVACAINE-EPINEPHRINE (PF) 0.25% -1:200000 IJ SOLN
INTRAMUSCULAR | Status: AC
  Filled 2022-11-11: qty 30

## 2022-11-11 MED ORDER — ACETAMINOPHEN 500 MG PO TABS
1000.0000 mg | ORAL_TABLET | ORAL | Status: AC
  Administered 2022-11-11: 1000 mg via ORAL
  Filled 2022-11-11: qty 2

## 2022-11-11 MED ORDER — KETOROLAC TROMETHAMINE 30 MG/ML IJ SOLN
INTRAMUSCULAR | Status: DC | PRN
Start: 2022-11-11 — End: 2022-11-11
  Administered 2022-11-11: 15 mg via INTRAVENOUS

## 2022-11-11 MED ORDER — DEXAMETHASONE SODIUM PHOSPHATE 10 MG/ML IJ SOLN
INTRAMUSCULAR | Status: DC | PRN
  Administered 2022-11-11: 4 mg via INTRAVENOUS

## 2022-11-11 MED ORDER — SODIUM CHLORIDE 0.9 % IV SOLN: 250.0000 mL | INTRAVENOUS | Status: DC | PRN

## 2022-11-11 MED ORDER — MIDAZOLAM HCL 2 MG/2ML IJ SOLN
INTRAMUSCULAR | Status: DC | PRN
  Administered 2022-11-11: 1 mg via INTRAVENOUS

## 2022-11-11 SURGICAL SUPPLY — 32 items
ADH SKN CLS APL DERMABOND .7 (GAUZE/BANDAGES/DRESSINGS) ×1
APL PRP STRL LF DISP 70% ISPRP (MISCELLANEOUS) ×1
BAG COUNTER SPONGE SURGICOUNT (BAG) ×1 IMPLANT
BAG SPNG CNTER NS LX DISP (BAG)
CANISTER SUCT 3000ML PPV (MISCELLANEOUS) ×1 IMPLANT
CHLORAPREP W/TINT 26 (MISCELLANEOUS) ×1 IMPLANT
CLIP TI MEDIUM 6 (CLIP) ×1 IMPLANT
COVER SURGICAL LIGHT HANDLE (MISCELLANEOUS) ×1 IMPLANT
DERMABOND ADVANCED .7 DNX12 (GAUZE/BANDAGES/DRESSINGS) ×1 IMPLANT
DEVICE DUBIN SPECIMEN MAMMOGRA (MISCELLANEOUS) ×1 IMPLANT
DRAPE CHEST BREAST 15X10 FENES (DRAPES) ×1 IMPLANT
ELECT COATED BLADE 2.86 ST (ELECTRODE) ×1 IMPLANT
ELECT REM PT RETURN 9FT ADLT (ELECTROSURGICAL) ×1
ELECTRODE REM PT RTRN 9FT ADLT (ELECTROSURGICAL) ×1 IMPLANT
GLOVE BIO SURGEON STRL SZ7 (GLOVE) ×2 IMPLANT
GLOVE BIOGEL PI IND STRL 7.5 (GLOVE) ×1 IMPLANT
GOWN STRL REUS W/ TWL LRG LVL3 (GOWN DISPOSABLE) ×2 IMPLANT
GOWN STRL REUS W/TWL LRG LVL3 (GOWN DISPOSABLE) ×2
KIT BASIN OR (CUSTOM PROCEDURE TRAY) ×1 IMPLANT
KIT MARKER MARGIN INK (KITS) ×1 IMPLANT
NDL HYPO 25GX1X1/2 BEV (NEEDLE) ×1 IMPLANT
NEEDLE HYPO 25GX1X1/2 BEV (NEEDLE) ×1
NS IRRIG 1000ML POUR BTL (IV SOLUTION) ×1 IMPLANT
PACK GENERAL/GYN (CUSTOM PROCEDURE TRAY) ×1 IMPLANT
STRIP CLOSURE SKIN 1/2X4 (GAUZE/BANDAGES/DRESSINGS) ×1 IMPLANT
SUT MNCRL AB 4-0 PS2 18 (SUTURE) ×1 IMPLANT
SUT SILK 3 0 SH 30 (SUTURE) IMPLANT
SUT VIC AB 3-0 SH 27 (SUTURE) ×1
SUT VIC AB 3-0 SH 27X BRD (SUTURE) ×1 IMPLANT
SYR CONTROL 10ML LL (SYRINGE) ×1 IMPLANT
TOWEL GREEN STERILE (TOWEL DISPOSABLE) ×1 IMPLANT
TOWEL GREEN STERILE FF (TOWEL DISPOSABLE) ×1 IMPLANT

## 2022-11-11 NOTE — Anesthesia Procedure Notes (Signed)
Procedure Name: Intubation Date/Time: 11/11/2022 10:36 AM  Performed by: Randon Goldsmith, CRNAPre-anesthesia Checklist: Patient identified, Emergency Drugs available, Suction available and Patient being monitored Patient Re-evaluated:Patient Re-evaluated prior to induction Oxygen Delivery Method: Circle system utilized Preoxygenation: Pre-oxygenation with 100% oxygen Induction Type: IV induction Ventilation: Mask ventilation without difficulty LMA: LMA inserted LMA Size: 4.0 Tube type: Oral Number of attempts: 1 Airway Equipment and Method: Stylet and Bite block Placement Confirmation: ETT inserted through vocal cords under direct vision, positive ETCO2 and breath sounds checked- equal and bilateral Tube secured with: Tape Dental Injury: Teeth and Oropharynx as per pre-operative assessment

## 2022-11-11 NOTE — Op Note (Signed)
Preoperative diagnosis: Clinical stage I right breast cancer Postoperative diagnosis: Same as above Procedure: Right breast lumpectomy Surgeon: Dr. Harden Mo Anesthesia: General Estimated blood loss: Minimal Complications: None Drains: Specimens: 1.  Right breast tissue containing heart clip marked with paint 2.  Additional superior and posterior margins marked short superior, long lateral, double deep Special count was correct at completion Disposition recovery stable addition   Indications:72 yof who is otherwise healthy presents after noting a right breast mass. No nipple discharge. At 6 oclock she has a 1 x 0.8x0.8 cm mass noted. Axillary [place] is negative. She also has multiple areas of calcifications that have been biopsied and are benign concordant. I asked radiology and they do not recommend any further evaluation and these can remain. She has a biopsy that shows a grade I IDC with int DCIS that is 95% er pos, 100% pr pos, her 2 negative and Ki is 10%. We discussed lumpectomy alone.    Procedure: After informed consent was obtained she was taken to the operating room.  She was given Ancef which she tolerated just fine.  SCDs were in place.  She was placed under general anesthesia without complication.  She was prepped and draped in the standard sterile surgical fashion.  Surgical timeout was then performed.   I located the seed in the lower central breast with ultrasound as well as palpation. I could visualize the clip.  I elected to make an incision overlying the mass after infiltrating marcaine.  I then dissected to the mass and removed it with a  rim of normal tissue in an attempt to get a clear margin.  I thought all my margins were clear but on imaging I thought posterior and superior could be close. I removed these .  The posterior margin is now the muscle.  Hemostasis was observed.  II then closed the cavity with 2-0 Vicryl and the skin was closed with 3-0 Vicryl and 4-0 Monocryl.   Glue and Steri-Strips were applied.  She tolerated this well was extubated and transferred recovery stable.

## 2022-11-11 NOTE — H&P (Signed)
87 yof who is otherwise healthy presents after noting a right breast mass. No nipple discharge. At 6 oclock she has a 1 x 0.8x0.8 cm mass noted. Axillary [place] is negative. She also has multiple areas of calcifications that have been biopsied and are benign concordant. I asked radiology and they do not recommend any further evaluation and these can remain. She has a biopsy that shows a grade I IDC with int DCIS that is 95% er pos, 100% pr pos, her 2 negative and Ki is 10%. She is here with her husband to discuss options  Review of Systems: A complete review of systems was obtained from the patient. I have reviewed this information and discussed as appropriate with the patient. See HPI as well for other ROS.  Review of Systems  All other systems reviewed and are negative.  Medical History: Past Medical History:  Diagnosis Date  History of cancer   Patient Active Problem List  Diagnosis  Malignant neoplasm of lower-outer quadrant of right breast of female, estrogen receptor positive (CMS/HHS-HCC)   Past Surgical History:  Procedure Laterality Date  Right Breast Biopsy Right 10/10/2022  Right Breast Biopsy Right 10/16/2022   Allergies  Allergen Reactions  Erythromycin Rash  Sulfa (Sulfonamide Antibiotics) Other (See Comments)   Current Outpatient Medications on File Prior to Visit  Medication Sig Dispense Refill  metFORMIN (GLUCOPHAGE) 500 MG tablet Take 500 mg by mouth daily with breakfast  mirtazapine (REMERON) 7.5 MG tablet Take 7.5 mg by mouth at bedtime  aspirin 81 MG EC tablet Take 81 mg by mouth once daily  cholecalciferol (VITAMIN D3) 1000 unit tablet Take 1,000 Units by mouth once daily  metoprolol succinate (TOPROL-XL) 25 MG XL tablet Take 25 mg by mouth once daily   Family History  Problem Relation Age of Onset  Myocardial Infarction (Heart attack) Mother   Social History   Tobacco Use  Smoking Status Never  Smokeless Tobacco Never  Marital status: Married  Tobacco  Use  Smoking status: Never  Smokeless tobacco: Never   Objective:   Physical Exam Vitals reviewed.  Constitutional:  Appearance: Normal appearance.  Chest:  Breasts: Right: Mass present. No inverted nipple or nipple discharge.  Left: No inverted nipple, mass or nipple discharge.  Comments: 1 cm mobile mass with small hematoma Lymphadenopathy:  Upper Body:  Right upper body: No supraclavicular or axillary adenopathy.  Left upper body: No supraclavicular or axillary adenopathy.  Neurological:  Mental Status: She is alert.   Assessment and Plan:   Malignant neoplasm of lower-outer quadrant of right breast of female, estrogen receptor positive (CMS/HHS-HCC)  Right breast seed guided lumpectomy  We discussed the staging and pathophysiology of breast cancer. We discussed all of the different options for treatment for breast cancer including surgery, chemotherapy, radiation therapy, Herceptin, and antiestrogen therapy.  We discussed a sentinel lymph node biopsy. I think with small tumor and age over 70 as well as negative ax [place] we can omit sn biopsy based on choosing wisely and SOUND.  We discussed the options for treatment of the breast cancer which included lumpectomy versus a mastectomy. We discussed the performance of the lumpectomy. We discussed a 5% chance of a positive margin requiring reexcision in the operating room. We also discussed that she will likely need radiation therapy if she undergoes lumpectomy. We discussed mastectomy and the postoperative care for that as well. Mastectomy can be followed by reconstruction. The decision for lumpectomy vs mastectomy has no impact on decision for chemotherapy.  Most mastectomy patients will not need radiation therapy. We discussed that there is no difference in her survival whether she undergoes lumpectomy with radiation therapy or antiestrogen therapy versus a mastectomy. There is also no real difference between her recurrence in the  breast.  We discussed the risks of operation including bleeding, infection, possible reoperation. She understands her further therapy will be based on what her stages at the time of her operation.

## 2022-11-11 NOTE — Transfer of Care (Signed)
Immediate Anesthesia Transfer of Care Note  Patient: Crystal Galvan  Procedure(s) Performed: RIGHT BREAST LUMPECTOMY (Right: Breast)  Patient Location: PACU  Anesthesia Type:General  Level of Consciousness: awake, alert , and oriented  Airway & Oxygen Therapy: Patient Spontanous Breathing  Post-op Assessment: Post -op Vital signs reviewed and stable  Post vital signs: stable  Last Vitals:  Vitals Value Taken Time  BP 165/109 11/11/22 1133  Temp    Pulse 59 11/11/22 1135  Resp 0 11/11/22 1135  SpO2 95 % 11/11/22 1135  Vitals shown include unfiled device data.  Last Pain:  Vitals:   11/11/22 0958  TempSrc:   PainSc: 0-No pain         Complications: No notable events documented.

## 2022-11-11 NOTE — Progress Notes (Signed)
Patient called last week and had lesion on arm and back.  Patient saw dermatology for questionable insect bite, poison ivy, or shingles.  Patient placed on airborne precautions for rule-out shingles in short stay.  Dr. Dwain Sarna assessed the patient upon arrival to short stay and stated the lesion is not shingles.  Patient taken off airborne precautions.

## 2022-11-11 NOTE — Interval H&P Note (Addendum)
History and Physical Interval Note:  11/11/2022 10:08 AM  Crystal Galvan  has presented today for surgery, with the diagnosis of RIGHT BREAST CANCER.  The various methods of treatment have been discussed with the patient and family. After consideration of risks, benefits and other options for treatment, the patient has consented to  Procedure(s): RIGHT BREAST LUMPECTOMY (Right) as a surgical intervention.  The patient's history has been reviewed, patient examined, no change in status, stable for surgery.  I have reviewed the patient's chart and labs.  Questions were answered to the patient's satisfaction.     [name]  I have also examined area that was concerned for shingles. This is not shingles. Her dermatologist did not think it was either

## 2022-11-11 NOTE — Anesthesia Postprocedure Evaluation (Signed)
Anesthesia Post Note  Patient: Crystal Galvan  Procedure(s) Performed: RIGHT BREAST LUMPECTOMY (Right: Breast)     Patient location during evaluation: PACU Anesthesia Type: General Level of consciousness: awake and alert Pain management: pain level controlled Vital Signs Assessment: post-procedure vital signs reviewed and stable Respiratory status: spontaneous breathing, nonlabored ventilation and respiratory function stable Cardiovascular status: blood pressure returned to baseline and stable Postop Assessment: no apparent nausea or vomiting Anesthetic complications: no   No notable events documented.  Last Vitals:  Vitals:   11/11/22 1145 11/11/22 1200  BP: (!) 140/74 (!) 172/83  Pulse: (!) 54 (!) 53  Resp: 11 13  Temp:  (!) 36.4 C  SpO2: 93% 100%    Last Pain:  Vitals:   11/11/22 1200  TempSrc:   PainSc: 0-No pain                 Lowella Curb

## 2022-11-11 NOTE — Discharge Instructions (Signed)
[facility] Office Phone Number [phone]  POST OP INSTRUCTIONS Take 400 mg of ibuprofen every 8 hours or 650 mg tylenol every 6 hours for next 72 hours then as needed. Use ice several times daily also.  A prescription for pain medication may be given to you upon discharge.  Take your pain medication as prescribed, if needed.  If narcotic pain medicine is not needed, then you may take acetaminophen (Tylenol), naprosyn (Alleve) or ibuprofen (Advil) as needed. Take your usually prescribed medications unless otherwise directed If you need a refill on your pain medication, please contact your pharmacy.  They will contact our office to request authorization.  Prescriptions will not be filled after 5pm or on week-ends. You should eat very light the first 24 hours after surgery, such as soup, crackers, pudding, etc.  Resume your normal diet the day after surgery. Most patients will experience some swelling and bruising in the breast.  Ice packs and a good support bra will help.  Wear the breast binder provided or a sports bra for 72 hours day and night.  After that wear a sports bra during the day until you return to the office. Swelling and bruising can take several days to resolve.  It is common to experience some constipation if taking pain medication after surgery.  Increasing fluid intake and taking a stool softener will usually help or prevent this problem from occurring.  A mild laxative (Milk of Magnesia or Miralax) should be taken according to package directions if there are no bowel movements after 48 hours. I used skin glue on the incision, you may shower in 24 hours.  The glue will flake off over the next 2-3 weeks.  Any sutures or staples will be removed at the office during your follow-up visit. ACTIVITIES:  You may resume regular daily activities (gradually increasing) beginning the next day.  Wearing a good support bra or sports bra minimizes pain and swelling.  You may have  sexual intercourse when it is comfortable. You may drive when you no longer are taking prescription pain medication, you can comfortably wear a seatbelt, and you can safely maneuver your car and apply brakes. RETURN TO WORK:  ______________________________________________________________________________________ You should see your doctor in the office for a follow-up appointment approximately two weeks after your surgery.  Your doctor's nurse will typically make your follow-up appointment when she calls you with your pathology report.  Expect your pathology report 3-4 business days after your surgery.  You may call to check if you do not hear from us after three days. OTHER INSTRUCTIONS: _______________________________________________________________________________________________ _____________________________________________________________________________________________________________________________________ _____________________________________________________________________________________________________________________________________ _____________________________________________________________________________________________________________________________________  WHEN TO CALL DR WAKEFIELD: Fever over 101.0 Nausea and/or vomiting. Extreme swelling or bruising. Continued bleeding from incision. Increased pain, redness, or drainage from the incision.  The clinic staff is available to answer your questions during regular business hours.  Please don't hesitate to call and ask to speak to one of the nurses for clinical concerns.  If you have a medical emergency, go to the nearest emergency room or call 911.  A surgeon from [facility] is always on call at the hospital.  For further questions, please visit [email or web address]  

## 2022-11-12 ENCOUNTER — Encounter: Payer: Self-pay | Admitting: *Deleted

## 2022-11-12 ENCOUNTER — Encounter (HOSPITAL_COMMUNITY): Payer: Self-pay | Admitting: General Surgery

## 2022-11-13 LAB — SURGICAL PATHOLOGY

## 2022-11-14 ENCOUNTER — Encounter: Payer: Self-pay | Admitting: General Practice

## 2022-11-14 NOTE — Progress Notes (Signed)
[facility] Spiritual Care Note  Reached Ms [facility] by phone at an inconvenient time (she was greeting friends at [employer] study, which is a key source of meaning-making and support), so she plans to contact chaplain again as needed/desired. She was very appreciative of the support and encouragement.   [address] [place], [place], [facility] Pager [phone] Voicemail [phone]

## 2022-11-15 ENCOUNTER — Other Ambulatory Visit: Payer: Self-pay | Admitting: Hematology and Oncology

## 2022-11-15 ENCOUNTER — Telehealth: Payer: Self-pay | Admitting: Hematology and Oncology

## 2022-11-15 DIAGNOSIS — Z170 Estrogen receptor positive status [ER+]: Secondary | ICD-10-CM

## 2022-11-15 NOTE — Progress Notes (Signed)
Per MD request, Mabry Santarelli, LPN successfully faxed [employer] request to [phone]. Obtained successful receipt of fax.

## 2022-11-15 NOTE — Telephone Encounter (Signed)
Patient is aware of  scheduled appointment times/dates

## 2022-11-18 ENCOUNTER — Encounter: Payer: Self-pay | Admitting: *Deleted

## 2022-11-18 ENCOUNTER — Telehealth: Payer: Self-pay | Admitting: Radiation Oncology

## 2022-11-18 DIAGNOSIS — C50511 Malignant neoplasm of lower-outer quadrant of right female breast: Secondary | ICD-10-CM

## 2022-11-18 NOTE — Telephone Encounter (Signed)
9/16 @ 2:40 pm Spoke to patient to be schedule for consult with ct sim to follow.  Patient not sure if she wants radiation treatments at this time, however she did accepted the appointment with Dr. Roselind Messier, but decline ct sim/treatment planning at this time.

## 2022-11-21 NOTE — Progress Notes (Signed)
Location of Breast Cancer: Malignant neoplasm of lower-outer quadrant of right breast of female, estrogen receptor positive, dcis right breast  Histology per Pathology Report:  11-11-22 FINAL MICROSCOPIC DIAGNOSIS:  A. BREAST, RIGHT, LUMPECTOMY: Invasive ductal carcinoma, 0.1 cm, grade 1 Ductal carcinoma in situ: Present, cribriform, 0.8 cm Margins, invasive: Negative     Closest, invasive: 10 mm, medial Margins, DCIS: Negative     Closest, DCIS: 2 mm, medial Lymphovascular invasion: Not identified Prognostic markers:  ER positive, PR positive, Her2 negative, Ki-67 10% Other: Biopsy site and biopsy clip See oncology table and comment  B. BREAST, RIGHT SUPERIOR MARGIN, EXCISION: Benign breast tissue Superior margin negative for carcinoma  C. BREAST, RIGHT POSTERIOR MARGIN, EXCISION: Benign breast tissue Posterior margin negative for carcinoma  ONCOLOGY TABLE: INVASIVE CARCINOMA OF THE BREAST:  Resection Procedure: Right breast lumpectomy with additional superior and posterior margin biopsies Specimen Laterality: Right breast Histologic Type: Ductal Histologic Grade:      Glandular (Acinar)/Tubular Differentiation: 2      Nuclear Pleomorphism: 2      Mitotic Rate: 1      Overall Grade: 1 Tumor Size: 0.1 cm invasive component in lumpectomy.  0.3 cm invasive component reported in previous core biopsy ([id number]) Ductal Carcinoma In Situ: Cribriform, intermediate nuclear grade without necrosis Lymphatic and/or Vascular Invasion: Not identified Treatment Effect in the Breast: No known presurgical therapy Margins: All margins negative for invasive carcinoma      Distance from Closest Margin (mm): 10 mm      Specify Closest Margin (required only if <74mm): Medial DCIS Margins: All margins negative for DCIS      Distance from Closest Margin (mm): 2 mm      Specify Closest Margin (required only if <21mm): Medial Regional Lymph Nodes: No lymph nodes submitted Distant  Metastasis:      Distant Site(s) Involved: Not applicable Breast Biomarker Testing Performed on Previous Biopsy:      Testing Performed on Case Number: [id number]            Estrogen Receptor: 95%, positive, strong staining intensity            Progesterone Receptor: 100%, positive, strong staining intensity            HER2: Negative (1+) with IHC            Ki-67: 10% Pathologic Stage Classification (pTNM, AJCC 8th Edition): pT1a (see comment), pN not assigned Representative Tumor Block: A2 Comment(s): Immunohistochemistry for basal cell markers p63, calponin and smooth muscle myosin show loss of staining in the residual invasive component supporting the diagnosis.  The TNM staging is based on the reported 0.3 cm invasive component in the previous needle core biopsy ([id number]). (v4.5.0.0)   Receptor Status: ER(95%), PR (100%), Her2-neu (neg), Ki-67(10%)  Did patient present with symptoms (if so, please note symptoms) or was this found on screening mammography?: 72 yof who is otherwise healthy presents after noting a right breast mass   Past/Anticipated interventions by surgeon, if any:  11-11-22 Preoperative diagnosis: Clinical stage I right breast cancer Postoperative diagnosis: Same as above Procedure: Right breast lumpectomy Surgeon: Dr. Harden Mo  Past/Anticipated interventions by medical oncology, if any:  Serena Croissant, MD 10/30/2022  Oncology History  Malignant neoplasm of lower-outer quadrant of right breast of female, estrogen receptor positive (HCC)  10/16/2022 Initial Diagnosis    Palpable right breast mass posteriorly with calcifications.  The calcifications were benign.  Mass measured 1 cm at 6 o'clock position biopsy: Grade  1 IDC with intermediate grade DCIS and intraductal papilloma ER 95%, PR 100%, Ki67 10%, HER2 IHC negative    10/30/2022 Cancer Staging    Staging form: Breast, AJCC 8th Edition - Clinical: Stage IA (cT1b, cN0, cM0, G1, ER+, PR+, HER2-) -  Signed by Serena Croissant, MD on 10/30/2022 Histologic grading system: 3 grade system  ASSESSMENT AND PLAN:  Malignant neoplasm of lower-outer quadrant of right breast of female, estrogen receptor positive (HCC) 10/16/2022:Palpable right breast mass posteriorly with calcifications.  The calcifications were benign.  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%, PR 100%, Ki67 10%, HER2 IHC negative   Pathology and radiology counseling:Discussed with the patient, the details of pathology including the type of breast cancer,the clinical staging, the significance of ER, PR and HER-2/neu receptors and the implications for treatment. After reviewing the pathology in detail, we proceeded to discuss the different treatment options between surgery, radiation, chemotherapy, antiestrogen therapies.   Recommendations: 1. Breast conserving surgery followed by 2. Oncotype DX testing to determine if chemotherapy would be of any benefit followed by 3. Adjuvant radiation therapy followed by 4. Adjuvant antiestrogen therapy: Patient had been on estrogen replacement therapy and she stopped it.  She does not like the way she feels.  She is likely not going to accept antiestrogen therapy.   Patient is very interested in complementary/alternative treatment options.  She is hoping to join a breast cancer program through integrative medicine that combines vitamin C infusions along with other complementary treatments.   She has been reading a breast cancer book that talks about dental infections and a dental root canals and implants can increase the cancer risk   Oncotype counseling: I discussed Oncotype DX test. I explained to the patient that this is a 21 gene panel to evaluate patient tumors DNA to calculate recurrence score. This would help determine whether patient has high risk or low risk breast cancer. She understands that if her tumor was  found to be high risk, she would benefit from systemic chemotherapy. If low risk, no need of chemotherapy.   Return to clinic after surgery to discuss final pathology report and then determine if Oncotype DX testing will need to be sent.  Lymphedema issues, if any:  no   Pain issues, if any:  no, reports swelling to right breast.   SAFETY ISSUES: Prior radiation? no Pacemaker/ICD? no Possible current pregnancy?no Is the patient on methotrexate? no  Current Complaints / other details:   BP (!) 141/72   Pulse 68   Temp (!) 97.2 F (36.2 C)   Resp 18   SpO2 100%

## 2022-11-22 DIAGNOSIS — Z170 Estrogen receptor positive status [ER+]: Secondary | ICD-10-CM | POA: Diagnosis not present

## 2022-11-22 DIAGNOSIS — C50511 Malignant neoplasm of lower-outer quadrant of right female breast: Secondary | ICD-10-CM | POA: Diagnosis not present

## 2022-11-25 ENCOUNTER — Telehealth: Payer: Self-pay | Admitting: *Deleted

## 2022-11-25 NOTE — Telephone Encounter (Signed)
[employer] sent week of 11/17/2022 with additional path request.

## 2022-12-02 ENCOUNTER — Encounter: Payer: Self-pay | Admitting: *Deleted

## 2022-12-05 ENCOUNTER — Encounter: Payer: Self-pay | Admitting: Radiation Oncology

## 2022-12-05 ENCOUNTER — Ambulatory Visit
Admission: RE | Admit: 2022-12-05 | Discharge: 2022-12-05 | Disposition: A | Discharge status: Home / Self Care | Payer: 59 | Source: Ambulatory Visit | Attending: Radiation Oncology | Admitting: Radiation Oncology

## 2022-12-05 ENCOUNTER — Inpatient Hospital Stay: Payer: 59 | Attending: Hematology and Oncology | Admitting: Hematology and Oncology

## 2022-12-05 VITALS — BP 141/72 | HR 68 | Temp 97.2°F | Resp 18 | Ht 68.0 in | Wt 128.7 lb

## 2022-12-05 VITALS — BP 141/72 | HR 68 | Temp 97.2°F | Resp 18

## 2022-12-05 DIAGNOSIS — Z7982 Long term (current) use of aspirin: Secondary | ICD-10-CM | POA: Insufficient documentation

## 2022-12-05 DIAGNOSIS — Z882 Allergy status to sulfonamides status: Secondary | ICD-10-CM | POA: Insufficient documentation

## 2022-12-05 DIAGNOSIS — Z881 Allergy status to other antibiotic agents status: Secondary | ICD-10-CM | POA: Diagnosis not present

## 2022-12-05 DIAGNOSIS — Z1732 Human epidermal growth factor receptor 2 negative status: Secondary | ICD-10-CM | POA: Diagnosis not present

## 2022-12-05 DIAGNOSIS — Z79899 Other long term (current) drug therapy: Secondary | ICD-10-CM | POA: Insufficient documentation

## 2022-12-05 DIAGNOSIS — Z7984 Long term (current) use of oral hypoglycemic drugs: Secondary | ICD-10-CM | POA: Insufficient documentation

## 2022-12-05 DIAGNOSIS — Z7902 Long term (current) use of antithrombotics/antiplatelets: Secondary | ICD-10-CM | POA: Insufficient documentation

## 2022-12-05 DIAGNOSIS — C50511 Malignant neoplasm of lower-outer quadrant of right female breast: Secondary | ICD-10-CM | POA: Insufficient documentation

## 2022-12-05 DIAGNOSIS — Z1721 Progesterone receptor positive status: Secondary | ICD-10-CM | POA: Diagnosis not present

## 2022-12-05 DIAGNOSIS — Z170 Estrogen receptor positive status [ER+]: Secondary | ICD-10-CM | POA: Insufficient documentation

## 2022-12-05 DIAGNOSIS — Z79624 Long term (current) use of inhibitors of nucleotide synthesis: Secondary | ICD-10-CM | POA: Insufficient documentation

## 2022-12-05 DIAGNOSIS — R232 Flushing: Secondary | ICD-10-CM | POA: Insufficient documentation

## 2022-12-05 MED ORDER — VENLAFAXINE HCL ER 37.5 MG PO CP24: 37.5000 mg | ORAL_CAPSULE | Freq: Every day | ORAL | 3 refills | Status: DC

## 2022-12-05 NOTE — Assessment & Plan Note (Signed)
11/11/2022:Right lumpectomy: Grade 1 IDC 0.1 cm, DCIS 0.8 cm, Marg negative LVI not identified, ER 95%, PR 100%, HER2 negative 1+ by IHC, Ki-67 10%  Pathology counseling: I discussed the final pathology report of the patient provided  a copy of this report. I discussed the margins as well as lymph node surgeries. We also discussed the final staging along with previously performed ER/PR and HER-2/neu testing.  I did not recommend Oncotype DX testing.  Treatment plan: Adjuvant radiation therapy Followed by adjuvant antiestrogen therapy (prior to the diagnosis she was on estrogen replacement therapy.  She is very reluctant to go on any estrogens and most likely may not be willing to take it)  Return to clinic after radiation is completed

## 2022-12-05 NOTE — Progress Notes (Signed)
Patient Care Team: Cleatis Polka., MD as PCP - General (Internal Medicine) Donnelly Angelica, RN as Oncology Nurse Navigator Pershing Proud, RN as Oncology Nurse Navigator Serena Croissant, MD as Consulting Physician (Hematology and Oncology) [name], MD as Consulting Physician (General Surgery) Antony Blackbird, MD as Consulting Physician (Radiation Oncology)  DIAGNOSIS:  Encounter Diagnosis  Name Primary?   Malignant neoplasm of lower-outer quadrant of right breast of female, estrogen receptor positive (HCC) Yes    SUMMARY OF ONCOLOGIC HISTORY: Oncology History  Malignant neoplasm of lower-outer quadrant of right breast of female, estrogen receptor positive (HCC)  10/16/2022 Initial Diagnosis   Palpable right breast mass posteriorly with calcifications.  The calcifications were benign.  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%, PR 100%, Ki67 10%, HER2 IHC negative   10/30/2022 Cancer Staging   Staging form: Breast, AJCC 8th Edition - Clinical: Stage IA (cT1b, cN0, cM0, G1, ER+, PR+, HER2-) - Signed by Serena Croissant, MD on 10/30/2022 Histologic grading system: 3 grade system   11/11/2022 Surgery   Right lumpectomy: Grade 1 IDC 0.1 cm, DCIS 0.8 cm, Marg negative LVI not identified, ER 95%, PR 100%, HER2 negative 1+ by IHC, Ki-67 10%     CHIEF COMPLIANT: Follow-up after recent right lumpectomy  Crystal Galvan is a 72 year old with above-mentioned history of right breast cancer underwent right lumpectomy and is here today accompanied by her husband to discuss the results of surgery.  She is healing and recovering very well from surgery.  She is on an extensive medication regimen by her holistic doctor that includes rosuvastatin, hydroxychloroquine, metformin and an additional 10-12 other medications. Her major complaint today is profound hot flashes.  ALLERGIES:  is allergic to erythromycin, sulfa  antibiotics, minocycline, pollen extract, and sulfamethoxazole-trimethoprim.  MEDICATIONS:  Current Outpatient Medications  Medication Sig Dispense Refill   aspirin EC 81 MG tablet Take 81 mg by mouth daily. Swallow whole.     Berberine Chloride 500 MG CAPS Take by mouth.     cholecalciferol (VITAMIN D) 1000 UNITS tablet Take 1,000 Units by mouth daily.     cimetidine (TAGAMET) 400 MG tablet Take 400 mg by mouth 2 (two) times daily.     dipyridamole (PERSANTINE) 75 MG tablet Take by mouth.     doxycycline (MONODOX) 100 MG capsule Take 100 mg by mouth 2 (two) times daily.     etodolac (LODINE) 300 MG capsule Take 300 mg by mouth 2 (two) times daily.     loratadine (CLARITIN) 10 MG tablet Take 10 mg by mouth daily.     lovastatin (MEVACOR) 20 MG tablet Take 20 mg by mouth daily.     metFORMIN (GLUCOPHAGE) 500 MG tablet Take 500 mg by mouth 2 (two) times daily.     metoprolol succinate (TOPROL-XL) 25 MG 24 hr tablet Take 25 mg by mouth daily.     thyroid (ARMOUR) 30 MG tablet Take 30 mg by mouth daily before breakfast.     TRINTELLIX 10 MG TABS tablet Take 10 mg by mouth daily.     Turmeric (QC TUMERIC COMPLEX) 500 MG CAPS Take by mouth.     valACYclovir (VALTREX) 1000 MG tablet Take 1,000 mg by mouth daily.     venlafaxine XR (EFFEXOR-XR) 37.5 MG 24 hr capsule Take 1 capsule (37.5 mg total) by mouth daily with breakfast. 90 capsule 3   No current facility-administered medications for this  visit.    PHYSICAL EXAMINATION: ECOG PERFORMANCE STATUS: 1 - Symptomatic but completely ambulatory  Vitals:   12/05/22 1329  BP: (!) 141/72  Pulse: 68  Resp: 18  Temp: (!) 97.2 F (36.2 C)  SpO2: 100%   Filed Weights   12/05/22 1329  Weight: 128 lb 11.2 oz (58.4 kg)      LABORATORY DATA:  I have reviewed the data as listed    Latest Ref Rng & Units 10/30/2022   12:33 PM  CMP  Glucose 70 - 99 mg/dL 99   BUN 8 - 23 mg/dL 20   Creatinine 4.09 - 1.00 mg/dL 8.11   Sodium 914 - 782 mmol/L  141   Potassium 3.5 - 5.1 mmol/L 4.3   Chloride 98 - 111 mmol/L 105   CO2 22 - 32 mmol/L 31   Calcium 8.9 - 10.3 mg/dL 9.5   Total Protein 6.5 - 8.1 g/dL 6.9   Total Bilirubin 0.3 - 1.2 mg/dL 0.6   Alkaline Phos 38 - 126 U/L 42   AST 15 - 41 U/L 18   ALT 0 - 44 U/L 12     Lab Results  Component Value Date   WBC 6.1 10/30/2022   HGB 13.4 10/30/2022   HCT 40.0 10/30/2022   MCV 94.1 10/30/2022   PLT 207 10/30/2022   NEUTROABS 4.1 10/30/2022    ASSESSMENT & PLAN:  Malignant neoplasm of lower-outer quadrant of right breast of female, estrogen receptor positive (HCC) 11/11/2022:Right lumpectomy: Grade 1 IDC 0.1 cm, DCIS 0.8 cm, Marg negative LVI not identified, ER 95%, PR 100%, HER2 negative 1+ by IHC, Ki-67 10%  Pathology counseling: I discussed the final pathology report of the patient provided  a copy of this report. I discussed the margins as well as lymph node surgeries. We also discussed the final staging along with previously performed ER/PR and HER-2/neu testing.  I did not recommend Oncotype DX testing.  Treatment plan: Adjuvant radiation therapy: Patient has appointment today to discuss the pros and cons of radiation. Followed by adjuvant antiestrogen therapy (prior to the diagnosis she was on estrogen replacement therapy.  She is very reluctant to go on any estrogens and most likely may not be willing to take it) I discussed with her that we can postpone antiestrogen therapy by 3 months until she recovers from the hot flashes. She is willing to consider that at that time. I sent a prescription for Effexor XR 37.5 mg daily.  This should help with the hot flashes and her mood swings.  Patient takes a lot of herbal medications prescribed by her physician for the purpose of treating "cancer stem cells".  Return to clinic in 3 months to discuss starting antiestrogen therapy. No orders of the defined types were placed in this encounter.  The patient has a good understanding of  the overall plan. she agrees with it. she will call with any problems that may develop before the next visit here. Total time spent: 30 mins including face to face time and time spent for planning, charting and co-ordination of care   Tamsen Meek, MD 12/05/22

## 2022-12-05 NOTE — Progress Notes (Signed)
Radiation Oncology         [phone] ________________________________  Name: Crystal Galvan MRN: [id number]  Date: 12/05/2022  DOB: 1950/07/03  Re-Evaluation Note  CC: Cleatis Polka., MD  Serena Croissant, MD    ICD-10-CM   1. Malignant neoplasm of lower-outer quadrant of right breast of female, estrogen receptor positive (HCC) [C50.511, Z17.0]  C50.511 Ambulatory referral to Social Work   Z17.0       Diagnosis: Stage IA (cT1b, cN0, cM0) Right Breast LOQ, Invasive ductal carcinoma with intermediate grade DCIS, ER+ / PR+ / Her2-, Grade 1  Narrative:  The patient returns today to discuss radiation treatment options. She was seen in the multidisciplinary breast clinic on 10/30/22.   Since her consultation date, she opted to proceed with a left breast lumpectomy without nodal biopsies on 11/11/22 under the care of Dr. Dwain Sarna. Pathology from the procedure revealed: tumor the size of 0.1 cm; histology of grade 1 invasive ductal carcinoma with intermediate grade DCIS; all margins negative for invasive and in situ carcinoma. Prognostic indicators significant for: estrogen receptor 95% positive and progesterone receptor 100% positive, both with strong staining intensity; Proliferation marker Ki67 at 10%; Her2 status negative; Grade 1.   She has conveyed to Dr. Pamelia Hoit that she is not interested in starting antiestrogen therapy after having an unpleasant experience with it in the past. She is interested in pursuing an integrative medicine approach as part of her therapy and has completed approximately 4 weeks of this therapy.  She is receiving this therapy at [facility] in [place].  Of note: she had a thyroid US performed on 10/31/22 which showed a normal appearing thyroid gland. Several tiny benign colloid nodules were noted incidentally.   On review of systems, the patient reports significant bruising in the right breast since her surgery.  She is also reported some  drainage from the area.  She is scheduled to see Dr. Dwain Sarna tomorrow..  She also reports significant emotional issues since discontinuing her estrogen therapy after she was diagnosed with breast cancer.  She also reports significant problems with hot flashes.    She did see Dr. Pamelia Hoit earlier today who discussed adjuvant hormonal therapy.  She was also given a prescription for Effexor to help with her symptoms.   Allergies:  is allergic to erythromycin, sulfa antibiotics, minocycline, pollen extract, and sulfamethoxazole-trimethoprim.  Meds: Current Outpatient Medications  Medication Sig Dispense Refill   aspirin EC 81 MG tablet Take 81 mg by mouth daily. Swallow whole.     Berberine Chloride 500 MG CAPS Take by mouth.     cholecalciferol (VITAMIN D) 1000 UNITS tablet Take 1,000 Units by mouth daily.     cimetidine (TAGAMET) 400 MG tablet Take 400 mg by mouth 2 (two) times daily.     dipyridamole (PERSANTINE) 75 MG tablet Take by mouth.     doxycycline (MONODOX) 100 MG capsule Take 100 mg by mouth 2 (two) times daily.     etodolac (LODINE) 300 MG capsule Take 300 mg by mouth 2 (two) times daily.     loratadine (CLARITIN) 10 MG tablet Take 10 mg by mouth daily.     lovastatin (MEVACOR) 20 MG tablet Take 20 mg by mouth daily.     metFORMIN (GLUCOPHAGE) 500 MG tablet Take 500 mg by mouth 2 (two) times daily.     metoprolol succinate (TOPROL-XL) 25 MG 24 hr tablet Take 25 mg by mouth daily.     thyroid (ARMOUR) 30 MG  tablet Take 30 mg by mouth daily before breakfast.     TRINTELLIX 10 MG TABS tablet Take 10 mg by mouth daily.     Turmeric (QC TUMERIC COMPLEX) 500 MG CAPS Take by mouth.     valACYclovir (VALTREX) 1000 MG tablet Take 1,000 mg by mouth daily.     venlafaxine XR (EFFEXOR-XR) 37.5 MG 24 hr capsule Take 1 capsule (37.5 mg total) by mouth daily with breakfast. 90 capsule 3   No current facility-administered medications for this encounter.    Physical Findings: The patient is  in no acute distress. Patient is alert and oriented.  Accompanied by her husband on evaluation.  Emotional throughout much of the discussion.  temperature is 97.2 F (36.2 C) (abnormal). Her blood pressure is 141/72 (abnormal) and her pulse is 68. Her respiration is 18 and oxygen saturation is 100%.   Lungs are clear to auscultation bilaterally. Heart has regular rate and rhythm. No palpable cervical, supraclavicular, or axillary adenopathy. Abdomen soft, non-tender, normal bowel sounds.  Right Breast: Extensive bruising in the lower aspect of the breast and some swelling noted.  Bandages present along the surgical area which were not removed today.  Lab Findings: Lab Results  Component Value Date   WBC 6.1 10/30/2022   HGB 13.4 10/30/2022   HCT 40.0 10/30/2022   MCV 94.1 10/30/2022   PLT 207 10/30/2022    Radiographic Findings: No results found.  Impression: Stage IA (cT1b, cN0, cM0) Right Breast LOQ, Invasive ductal carcinoma with intermediate grade DCIS, ER+ / PR+ / Her2-, Grade 1  Today we discussed her excellent prognosis.  She was found to have a small area of residual invasive tumor at the time of her lumpectomy.  She was also found to have DCIS which was also completely resected with close 2 mm margins.  We discussed that radiation therapy would be optional for her given her good prognosis.  We also discussed that 1 option for her would be to proceed with radiation therapy omit adjuvant hormonal therapy given her significant issues with discontinuing estrogen replacement.  We discussed that she would be a candidate for hypofractionated accelerated radiation therapy over approximately 4 weeks.  Given her relatively young age would not recommend ultra hypofractionated radiation therapy.  At this time she is undecided which treatment approach she will go with.  I did recommend she consider either radiation therapy or adjuvant hormonal therapy.  Recommended that she do at least one of  the therapies as adjuvant treatment.  Plan: She is tentatively scheduled for simulation on October 21 but she may ultimately decide against radiation treatment.  I have asked her to call if she decides not to pursue radiation therapy.  -----------------------------------  Billie Lade, PhD, MD  This document serves as a record of services personally performed by Antony Blackbird, MD. It was created on his behalf by Neena Rhymes, a trained medical scribe. The creation of this record is based on the scribe's personal observations and the provider's statements to them. This document has been checked and approved by the attending provider.

## 2022-12-06 ENCOUNTER — Telehealth: Payer: Self-pay

## 2022-12-06 LAB — SIGNATERA ONLY (NATERA MANAGED)
SIGNATERA MTM READOUT: 0 MTM/ml
SIGNATERA TEST RESULT: NEGATIVE

## 2022-12-06 NOTE — Telephone Encounter (Signed)
Called pt per MD to advise [facility] testing was negative/not detected. Pt verbalized understanding of results and knows [facility] will be in touch to schedule 3 mo repeat lab.   

## 2022-12-09 ENCOUNTER — Telehealth: Payer: Self-pay

## 2022-12-09 NOTE — Telephone Encounter (Signed)
[facility] Clinical Social Work  Clinical Social Work was referred by medical provider for assessment of psychosocial needs.  Clinical Social Worker contacted patient by phone to offer support and assess for needs.  Patient denied any need for SW assessment at this time. CSW provided direct contact information should needs change.   Marguerita Merles, LCSW A Clinical Social Worker [facility]

## 2022-12-11 ENCOUNTER — Encounter: Payer: Self-pay | Admitting: *Deleted

## 2022-12-18 ENCOUNTER — Telehealth: Payer: Self-pay | Admitting: *Deleted

## 2022-12-18 NOTE — Telephone Encounter (Signed)
Pt left vm regarding appts with Dr. Mitzi Hansen to be moved out after 10/19 as she is getting a 2nd opinion on xrt at [facility]. Dr. Roselind Messier notified, request sent to scheduled to move her appt.

## 2022-12-23 ENCOUNTER — Ambulatory Visit: Payer: 59 | Admitting: Radiation Oncology

## 2022-12-31 ENCOUNTER — Encounter: Payer: Self-pay | Admitting: *Deleted

## 2022-12-31 DIAGNOSIS — C50511 Malignant neoplasm of lower-outer quadrant of right female breast: Secondary | ICD-10-CM | POA: Diagnosis not present

## 2022-12-31 DIAGNOSIS — Z170 Estrogen receptor positive status [ER+]: Secondary | ICD-10-CM | POA: Diagnosis not present

## 2023-01-02 DIAGNOSIS — C50911 Malignant neoplasm of unspecified site of right female breast: Secondary | ICD-10-CM | POA: Diagnosis not present

## 2023-01-03 ENCOUNTER — Telehealth: Payer: Self-pay | Admitting: Radiation Oncology

## 2023-01-03 NOTE — Telephone Encounter (Signed)
Per Crystal Galvan pt decided to receive treatment at [facility]. All appointments cancelled, closing referral until further notice.

## 2023-01-06 ENCOUNTER — Ambulatory Visit: Payer: 59 | Admitting: Radiation Oncology

## 2023-01-06 ENCOUNTER — Ambulatory Visit: Payer: 59

## 2023-01-07 ENCOUNTER — Ambulatory Visit: Payer: 59

## 2023-01-08 ENCOUNTER — Ambulatory Visit: Payer: 59

## 2023-01-09 ENCOUNTER — Ambulatory Visit: Payer: 59

## 2023-01-10 ENCOUNTER — Ambulatory Visit: Payer: 59

## 2023-01-13 ENCOUNTER — Telehealth: Payer: Self-pay | Admitting: Hematology and Oncology

## 2023-01-13 ENCOUNTER — Encounter: Payer: Self-pay | Admitting: *Deleted

## 2023-01-13 ENCOUNTER — Ambulatory Visit: Payer: 59

## 2023-01-13 NOTE — Telephone Encounter (Signed)
 Left patient a vm regarding upcoming appointment

## 2023-01-14 ENCOUNTER — Ambulatory Visit: Payer: 59

## 2023-01-15 ENCOUNTER — Ambulatory Visit: Payer: 59

## 2023-01-16 ENCOUNTER — Ambulatory Visit: Payer: 59

## 2023-01-17 ENCOUNTER — Ambulatory Visit: Payer: 59

## 2023-01-20 ENCOUNTER — Ambulatory Visit: Payer: 59

## 2023-01-21 ENCOUNTER — Ambulatory Visit: Payer: 59

## 2023-01-21 ENCOUNTER — Ambulatory Visit: Payer: 59 | Admitting: Radiation Oncology

## 2023-01-22 ENCOUNTER — Ambulatory Visit: Payer: 59

## 2023-01-23 ENCOUNTER — Ambulatory Visit: Payer: 59

## 2023-01-24 ENCOUNTER — Ambulatory Visit: Payer: 59

## 2023-01-27 ENCOUNTER — Ambulatory Visit: Payer: 59

## 2023-01-28 ENCOUNTER — Ambulatory Visit: Payer: 59

## 2023-01-29 ENCOUNTER — Ambulatory Visit: Payer: 59

## 2023-02-03 ENCOUNTER — Ambulatory Visit: Payer: 59

## 2023-02-04 ENCOUNTER — Ambulatory Visit: Payer: 59

## 2023-02-04 ENCOUNTER — Ambulatory Visit: Payer: 59 | Admitting: Radiation Oncology

## 2023-02-05 ENCOUNTER — Ambulatory Visit: Payer: 59

## 2023-02-05 ENCOUNTER — Encounter: Payer: Self-pay | Admitting: *Deleted

## 2023-02-06 ENCOUNTER — Ambulatory Visit: Payer: 59 | Admitting: Radiation Oncology

## 2023-02-06 ENCOUNTER — Ambulatory Visit: Payer: 59

## 2023-02-06 DIAGNOSIS — Z170 Estrogen receptor positive status [ER+]: Secondary | ICD-10-CM | POA: Diagnosis not present

## 2023-02-06 DIAGNOSIS — C50511 Malignant neoplasm of lower-outer quadrant of right female breast: Secondary | ICD-10-CM | POA: Diagnosis not present

## 2023-02-07 ENCOUNTER — Ambulatory Visit: Payer: 59

## 2023-02-10 ENCOUNTER — Ambulatory Visit: Payer: 59

## 2023-02-11 ENCOUNTER — Ambulatory Visit: Payer: 59

## 2023-02-12 ENCOUNTER — Ambulatory Visit: Payer: 59

## 2023-02-13 ENCOUNTER — Ambulatory Visit: Payer: 59

## 2023-02-14 ENCOUNTER — Ambulatory Visit: Payer: 59

## 2023-02-14 DIAGNOSIS — L7682 Other postprocedural complications of skin and subcutaneous tissue: Secondary | ICD-10-CM | POA: Diagnosis not present

## 2023-02-14 LAB — SIGNATERA
SIGNATERA MTM READOUT: 0 MTM/ml
SIGNATERA TEST RESULT: NEGATIVE

## 2023-02-17 ENCOUNTER — Ambulatory Visit: Payer: 59

## 2023-02-18 ENCOUNTER — Encounter: Payer: Self-pay | Admitting: *Deleted

## 2023-02-18 ENCOUNTER — Ambulatory Visit: Payer: 59

## 2023-02-18 ENCOUNTER — Inpatient Hospital Stay: Payer: 59 | Attending: Hematology and Oncology | Admitting: Hematology and Oncology

## 2023-02-18 VITALS — BP 137/57 | HR 65 | Temp 97.3°F | Resp 18 | Ht 68.0 in | Wt 132.0 lb

## 2023-02-18 DIAGNOSIS — Z79811 Long term (current) use of aromatase inhibitors: Secondary | ICD-10-CM | POA: Insufficient documentation

## 2023-02-18 DIAGNOSIS — L812 Freckles: Secondary | ICD-10-CM | POA: Diagnosis not present

## 2023-02-18 DIAGNOSIS — C50511 Malignant neoplasm of lower-outer quadrant of right female breast: Secondary | ICD-10-CM | POA: Diagnosis not present

## 2023-02-18 DIAGNOSIS — Z170 Estrogen receptor positive status [ER+]: Secondary | ICD-10-CM | POA: Diagnosis not present

## 2023-02-18 DIAGNOSIS — L821 Other seborrheic keratosis: Secondary | ICD-10-CM | POA: Diagnosis not present

## 2023-02-18 DIAGNOSIS — Z79624 Long term (current) use of inhibitors of nucleotide synthesis: Secondary | ICD-10-CM | POA: Insufficient documentation

## 2023-02-18 DIAGNOSIS — Z7902 Long term (current) use of antithrombotics/antiplatelets: Secondary | ICD-10-CM | POA: Insufficient documentation

## 2023-02-18 DIAGNOSIS — Z7982 Long term (current) use of aspirin: Secondary | ICD-10-CM | POA: Diagnosis not present

## 2023-02-18 DIAGNOSIS — Z79899 Other long term (current) drug therapy: Secondary | ICD-10-CM | POA: Insufficient documentation

## 2023-02-18 DIAGNOSIS — Z85828 Personal history of other malignant neoplasm of skin: Secondary | ICD-10-CM | POA: Diagnosis not present

## 2023-02-18 MED ORDER — ANASTROZOLE 1 MG PO TABS: 1.0000 mg | ORAL_TABLET | Freq: Every day | ORAL | 3 refills | Status: DC

## 2023-02-18 NOTE — Assessment & Plan Note (Signed)
11/11/2022:Right lumpectomy: Grade 1 IDC 0.1 cm, DCIS 0.8 cm, Marg negative LVI not identified, ER 95%, PR 100%, HER2 negative 1+ by IHC, Ki-67 10%    Treatment plan: Adjuvant radiation therapy: Patient has appointment today to discuss the pros and cons of radiation. Followed by adjuvant antiestrogen therapy (prior to the diagnosis she was on estrogen replacement therapy.  She is very reluctant to go on any estrogens and most likely may not be willing to take it) I discussed with her that we can postpone antiestrogen therapy by 3 months until she recovers from the hot flashes. She is willing to consider that at that time. I sent a prescription for Effexor XR 37.5 mg daily.  This should help with the hot flashes and her mood swings.

## 2023-02-18 NOTE — Progress Notes (Signed)
Patient Care Team: Crystal Polka., MD as PCP - General (Internal Medicine) Crystal Angelica, RN as Oncology Nurse Navigator Crystal Proud, RN as Oncology Nurse Navigator Crystal Croissant, MD as Consulting Physician (Hematology and Oncology) Crystal Loron, MD as Consulting Physician (General Surgery) Crystal Blackbird, MD as Consulting Physician (Radiation Oncology)  DIAGNOSIS:  Encounter Diagnosis  Name Primary?   Malignant neoplasm of lower-outer quadrant of right breast of female, estrogen receptor positive (HCC) Yes    SUMMARY OF ONCOLOGIC HISTORY: Oncology History  Malignant neoplasm of lower-outer quadrant of right breast of female, estrogen receptor positive (HCC)  10/16/2022 Initial Diagnosis   Palpable right breast mass posteriorly with calcifications.  The calcifications were benign.  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%, PR 100%, Ki67 10%, HER2 IHC negative   10/30/2022 Cancer Staging   Staging form: Breast, AJCC 8th Edition - Clinical: Stage IA (cT1b, cN0, cM0, G1, ER+, PR+, HER2-) - Signed by Crystal Croissant, MD on 10/30/2022 Histologic grading system: 3 grade system   11/11/2022 Surgery   Right lumpectomy: Grade 1 IDC 0.1 cm, DCIS 0.8 cm, Marg negative LVI not identified, ER 95%, PR 100%, HER2 negative 1+ by IHC, Ki-67 10%     CHIEF COMPLIANT:   HISTORY OF PRESENT ILLNESS:   History of Present Illness   Crystal Galvan, a patient with a history of breast cancer, presents with a non-healing surgical wound three and a half months post-surgery. She reports that the wound developed after a hematoma, which she believes was caused by a high dose of vitamin C. The wound has not closed despite multiple interventions, including sutures and silver application. The patient reports that the wound is not bleeding but is oozing slightly.  In addition to the wound, Crystal Galvan is experiencing hot flashes and  cognitive fog, which she attributes to a lack of estrogen following her cancer treatment. She reports that these symptoms have not improved significantly and are impacting her quality of life.  Crystal Galvan has been considering radiation therapy but has not started due to the non-healing wound. She has consulted with [facility]. Because of her wound care issues, she does not want to do XRT  The patient also reports that she has been in contact [facility] who specializes in alternative treatments for cancer patients. She is considering trying testosterone or other non-estrogen treatments to help manage her symptoms.      ALLERGIES:  is allergic to erythromycin, sulfa antibiotics, minocycline, pollen extract, and sulfamethoxazole-trimethoprim.  MEDICATIONS:  Current Outpatient Medications  Medication Sig Dispense Refill   [START ON 03/05/2023] anastrozole (ARIMIDEX) 1 MG tablet Take 1 tablet (1 mg total) by mouth daily. 90 tablet 3   aspirin EC 81 MG tablet Take 81 mg by mouth daily. Swallow whole.     Berberine Chloride 500 MG CAPS Take by mouth.     cholecalciferol (VITAMIN D) 1000 UNITS tablet Take 1,000 Units by mouth daily.     cimetidine (TAGAMET) 400 MG tablet Take 400 mg by mouth 2 (two) times daily.     dipyridamole (PERSANTINE) 75 MG tablet Take by mouth.     doxycycline (MONODOX) 100 MG capsule Take 100 mg by mouth 2 (two) times daily.     etodolac (LODINE) 300 MG capsule Take 300 mg by mouth 2 (two) times daily.     loratadine (CLARITIN) 10 MG tablet Take 10 mg by mouth daily.  lovastatin (MEVACOR) 20 MG tablet Take 20 mg by mouth daily.     metFORMIN (GLUCOPHAGE) 500 MG tablet Take 500 mg by mouth 2 (two) times daily.     metoprolol succinate (TOPROL-XL) 25 MG 24 hr tablet Take 25 mg by mouth daily.     thyroid (ARMOUR) 30 MG tablet Take 30 mg by mouth daily before breakfast.     TRINTELLIX 10 MG TABS tablet Take 10 mg by mouth daily.     Turmeric (QC TUMERIC COMPLEX) 500 MG  CAPS Take by mouth.     valACYclovir (VALTREX) 1000 MG tablet Take 1,000 mg by mouth daily.     venlafaxine XR (EFFEXOR-XR) 37.5 MG 24 hr capsule Take 1 capsule (37.5 mg total) by mouth daily with breakfast. 90 capsule 3   No current facility-administered medications for this visit.    PHYSICAL EXAMINATION: ECOG PERFORMANCE STATUS: 1 - Symptomatic but completely ambulatory  Vitals:   02/18/23 1428  BP: (!) 137/57  Pulse: 65  Resp: 18  Temp: (!) 97.3 F (36.3 C)  SpO2: 100%   Filed Weights   02/18/23 1428  Weight: 132 lb (59.9 kg)     LABORATORY DATA:  I have reviewed the data as listed    Latest Ref Rng & Units 10/30/2022   12:33 PM  CMP  Glucose 70 - 99 mg/dL 99   BUN 8 - 23 mg/dL 20   Creatinine 1.91 - 1.00 mg/dL 4.78   Sodium 295 - 621 mmol/L 141   Potassium 3.5 - 5.1 mmol/L 4.3   Chloride 98 - 111 mmol/L 105   CO2 22 - 32 mmol/L 31   Calcium 8.9 - 10.3 mg/dL 9.5   Total Protein 6.5 - 8.1 g/dL 6.9   Total Bilirubin 0.3 - 1.2 mg/dL 0.6   Alkaline Phos 38 - 126 U/L 42   AST 15 - 41 U/L 18   ALT 0 - 44 U/L 12     Lab Results  Component Value Date   WBC 6.1 10/30/2022   HGB 13.4 10/30/2022   HCT 40.0 10/30/2022   MCV 94.1 10/30/2022   PLT 207 10/30/2022   NEUTROABS 4.1 10/30/2022    ASSESSMENT & PLAN:  Malignant neoplasm of lower-outer quadrant of right breast of female, estrogen receptor positive (HCC) 11/11/2022:Right lumpectomy: Grade 1 IDC 0.1 cm, DCIS 0.8 cm, Marg negative LVI not identified, ER 95%, PR 100%, HER2 negative 1+ by IHC, Ki-67 10%    Treatment plan: Adjuvant radiation therapy: Decided not to do XRT because of wound care issues. Followed by adjuvant antiestrogen therapy (prior to the diagnosis she was on estrogen replacement therapy.)  On Effexor XR 37.5 mg daily.  This should help with the hot flashes and her mood swings. ------------------------------------- Assessment and Plan    Post-operative wound healing Delayed healing of  surgical incision following breast surgery, with hematoma formation and subsequent incision dehiscence. The wound has not closed despite multiple interventions including loose suturing and application of silver. No signs of infection. -Continue wound care as directed by Dr. Dwain Sarna. -Consider surgical intervention for wound closure and scar tissue removal after the first of the year.  Breast Cancer Estrogen receptor positive breast cancer, post-surgery. Radiation therapy was not completed due to wound healing complications. Discussed the benefits and risks of antiestrogen therapy, specifically aromatase inhibitors. -Start Anastrozole 1mg , half a tablet daily for a month, then reassess tolerance and consider increasing to daily dosing.  Hot flashes and mood swings Persistent hot flashes and  mood swings, likely due to estrogen withdrawal. Currently on Effexor, which has provided some relief. -Continue Effexor for mood stabilization and some relief of hot flashes. -Consider non-hormonal options for hot flash management if symptoms persist.  Bone health Risk of osteoporosis due to proposed aromatase inhibitor therapy. -Start Vitamin D 2000 IU daily. -Schedule bone density test for next month.  Follow-up -Phone call in the first week of February to assess tolerance to Anastrozole. -Continue participation in the [facility] test, with frequency of testing to be determined.      Telephone appt in 1 month to discuss tolerance to half a tab of anastrozole    No orders of the defined types were placed in this encounter.  The patient has a good understanding of the overall plan. she agrees with it. she will call with any problems that may develop before the next visit here. Total time spent: 30 mins including face to face time and time spent for planning, charting and co-ordination of care   Tamsen Meek, MD 02/18/23

## 2023-02-19 ENCOUNTER — Telehealth: Payer: Self-pay

## 2023-02-19 NOTE — Telephone Encounter (Signed)
 Called pt per MD to advise [facility] testing was negative/not detected. Pt verbalized understanding of results and knows [facility] will be in touch to schedule 3-6 mo repeat lab.

## 2023-02-20 DIAGNOSIS — F32A Depression, unspecified: Secondary | ICD-10-CM | POA: Diagnosis not present

## 2023-02-20 DIAGNOSIS — E559 Vitamin D deficiency, unspecified: Secondary | ICD-10-CM | POA: Diagnosis not present

## 2023-02-20 DIAGNOSIS — C50911 Malignant neoplasm of unspecified site of right female breast: Secondary | ICD-10-CM | POA: Diagnosis not present

## 2023-02-20 DIAGNOSIS — E039 Hypothyroidism, unspecified: Secondary | ICD-10-CM | POA: Diagnosis not present

## 2023-02-20 DIAGNOSIS — N951 Menopausal and female climacteric states: Secondary | ICD-10-CM | POA: Diagnosis not present

## 2023-03-04 ENCOUNTER — Telehealth: Payer: Self-pay | Admitting: Adult Health

## 2023-03-06 ENCOUNTER — Encounter (HOSPITAL_BASED_OUTPATIENT_CLINIC_OR_DEPARTMENT_OTHER): Payer: Self-pay | Admitting: General Surgery

## 2023-03-06 ENCOUNTER — Other Ambulatory Visit: Payer: Self-pay

## 2023-03-07 DIAGNOSIS — L7632 Postprocedural hematoma of skin and subcutaneous tissue following other procedure: Secondary | ICD-10-CM | POA: Diagnosis not present

## 2023-03-07 NOTE — Progress Notes (Signed)
   PROVIDER:  DONNICE CARLIN BURY, MD  MRN: [id number] DOB: 1950/12/02 DATE OF ENCOUNTER: 03/07/2023 Interval History:     73 year old female who underwent a right breast lumpectomy. She is doing well except for a hematoma. She had her final pathology is a 1 mm invasive ductal carcinoma that is grade 1 and 8 mm of DCIS. The margins for the invasive tumor clear at 10 mm and the DCIS is 2 mm at the medial margin. This previously was ER positive, PR positive, HER2 negative with a low Ki-67. The wound has now healed and no more drainage  Physical Examination:   Physical Exam   Right IM incision is healed, no infection, exuberant scar tissue with incision retracted   Assessment and Plan:     I am going to cancel the revision that we had scheduled for next week.  There is a chance certainly for cosmesis that she may need to have this revised but I think I would give this several months and we discussed scar massage.  I will have her return in 3 months.  MATTHEW CARLIN BURY, MD

## 2023-03-13 ENCOUNTER — Ambulatory Visit (HOSPITAL_BASED_OUTPATIENT_CLINIC_OR_DEPARTMENT_OTHER): Admit: 2023-03-13 | Payer: 59 | Admitting: General Surgery

## 2023-03-13 SURGERY — EXCISION, LESION, BREAST
Anesthesia: Choice | Site: Breast | Laterality: Right

## 2023-04-03 DIAGNOSIS — E039 Hypothyroidism, unspecified: Secondary | ICD-10-CM | POA: Diagnosis not present

## 2023-04-03 DIAGNOSIS — N951 Menopausal and female climacteric states: Secondary | ICD-10-CM | POA: Diagnosis not present

## 2023-04-07 ENCOUNTER — Inpatient Hospital Stay: Payer: 59 | Attending: Hematology and Oncology | Admitting: Hematology and Oncology

## 2023-04-07 DIAGNOSIS — Z170 Estrogen receptor positive status [ER+]: Secondary | ICD-10-CM | POA: Diagnosis not present

## 2023-04-07 DIAGNOSIS — C50511 Malignant neoplasm of lower-outer quadrant of right female breast: Secondary | ICD-10-CM

## 2023-04-07 NOTE — Assessment & Plan Note (Signed)
11/11/2022:Right lumpectomy: Grade 1 IDC 0.1 cm, DCIS 0.8 cm, Marg negative LVI not identified, ER 95%, PR 100%, HER2 negative 1+ by IHC, Ki-67 10%    Treatment plan: Adjuvant radiation therapy: Decided not to do XRT because of wound care issues. Followed by adjuvant antiestrogen therapy (prior to the diagnosis she was on estrogen replacement therapy.)  ------------------------------------- Current treatment: Anastrozole 1 mg daily (patient has not started): worried about hot flashes  Mammograms Aug 2025 [facility]: Negative  Wound Healing: Finally better  Hot flashes and her mood swings: Increased dose of Effexor to 2 tabs daily (if she tolerates it, then I will send her the 75 mg prescription)  We will wait to hear back when she is ready to start antiestrogen therapy.  At this time because of the severity of hot flashes she is not in a place to initiate such treatment.

## 2023-04-07 NOTE — Progress Notes (Signed)
HEMATOLOGY-ONCOLOGY TELEPHONE VISIT PROGRESS NOTE  I connected with our patient on 04/07/23 at  2:00 PM EST by telephone and verified that I am speaking with the correct person using two identifiers.  I discussed the limitations, risks, security and privacy concerns of performing an evaluation and management service by telephone and the availability of in person appointments.  I also discussed with the patient that there may be a patient responsible charge related to this service. The patient expressed understanding and agreed to proceed.   History of Present Illness: Follow-up of severe hot flashes  History of Present Illness   The patient presents with ongoing hot flashes and follow-up on breast cancer surveillance. She reports that she has not started her anti estrogen therapy at this time.  She experiences hot flashes almost throughout the day and is currently trying a medication regimen. She plans to assess its effectiveness over the next week to ten days and has not yet started a higher dose.  She recently had her hormones tested, including progesterone, estrogen, and thyroid levels, and is awaiting the results.  She had a diagnostic mammogram on August 1st, following an initial mammogram in March, which did not detect any issues. She is due for another mammogram, which will be scheduled around August 2nd. She prefers 3D mammograms, which are now standard.  She inquired about the continuation of the Signatera test, which is scheduled every three months for the first couple of years and then every six months. Her last test was in December, and the next is due in March.  She expressed concerns about the cosmetic appearance of her breast following a hematoma and prolonged healing. She is massaging the area with vitamin E as recommended by another doctor.        Oncology History  Malignant neoplasm of lower-outer quadrant of right breast of female, estrogen receptor positive (HCC)   10/16/2022 Initial Diagnosis   Palpable right breast mass posteriorly with calcifications.  The calcifications were benign.  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%, PR 100%, Ki67 10%, HER2 IHC negative   10/30/2022 Cancer Staging   Staging form: Breast, AJCC 8th Edition - Clinical: Stage IA (cT1b, cN0, cM0, G1, ER+, PR+, HER2-) - Signed by Serena Croissant, MD on 10/30/2022 Histologic grading system: 3 grade system   11/11/2022 Surgery   Right lumpectomy: Grade 1 IDC 0.1 cm, DCIS 0.8 cm, Marg negative LVI not identified, ER 95%, PR 100%, HER2 negative 1+ by IHC, Ki-67 10%     REVIEW OF SYSTEMS:   Constitutional: Denies fevers, chills or abnormal weight loss All other systems were reviewed with the patient and are negative. Observations/Objective:     Assessment Plan:  Malignant neoplasm of lower-outer quadrant of right breast of female, estrogen receptor positive (HCC) 11/11/2022:Right lumpectomy: Grade 1 IDC 0.1 cm, DCIS 0.8 cm, Marg negative LVI not identified, ER 95%, PR 100%, HER2 negative 1+ by IHC, Ki-67 10%    Treatment plan: Adjuvant radiation therapy: Decided not to do XRT because of wound care issues. Followed by adjuvant antiestrogen therapy (prior to the diagnosis she was on estrogen replacement therapy.)  ------------------------------------- Current treatment: Anastrozole 1 mg daily (patient has not started): worried about hot flashes  Mammograms Aug 2025 Signatera: Negative  Wound Healing: Finally better  Hot flashes and her mood swings: Increased dose of Effexor to 2 tabs daily (if she tolerates it, then I will send her  the 75 mg prescription)  We will wait to hear back when she is ready to start antiestrogen therapy.  At this time because of the severity of hot flashes she is not in a place to initiate such treatment. --------------------------------- Assessment and Plan    Breast  Cancer Surveillance Requires ongoing surveillance following breast cancer treatment. Last diagnostic mammogram on August 1st; next due around August 2nd. Signatera test scheduled every three months for the first couple of years, then every six months. Discussed the importance of continued surveillance and the lifelong nature of the Signatera test. - Schedule mammogram around August 2nd - Continue Signatera test every three months for the first couple of years  Post-Surgical Breast Deformity Inquired about cosmetic surgery for breast deformity due to prolonged healing and hematoma. Advised to wait until year-end for complete healing before considering surgery. Discussed non-surgical options like fat grafting. Plan to massage with vitamin E for three months and follow-up with Dr. Dwain Sarna. - Consider cosmetic surgery towards the end of the year - Massage with vitamin E for three months - Follow-up with Dr. Bethann Humble Flashes Experiencing frequent hot flashes. Current treatment plan involves monitoring the effectiveness of the current medication dosage before considering an increase. - Monitor current medication dosage for one week - Send MyChart message if current dosage is effective - If effective, send prescription for 75 mg dosage  Hormone Levels Recently had hormone levels tested, including progesterone, estrogen, and thyroid levels. Awaiting results to inform further treatment decisions. - Forward hormone test results via MyChart  General Health Maintenance Upcoming survivorship care plan appointment in April to discuss diet, nutrition, exercise, and supplements. This appointment will also set up future surveillance and provide an end of treatment summary. - Attend survivorship care plan appointment in April  Follow-up - Send MyChart message regarding medication effectiveness - Forward hormone test results via MyChart - Schedule mammogram around August 2nd - Continue Signatera  test every three months - Attend survivorship care plan appointment in April.          I discussed the assessment and treatment plan with the patient. The patient was provided an opportunity to ask questions and all were answered. The patient agreed with the plan and demonstrated an understanding of the instructions. The patient was advised to call back or seek an in-person evaluation if the symptoms worsen or if the condition fails to improve as anticipated.   I provided 20 minutes of non-face-to-face time during this encounter.  This includes time for charting and coordination of care   Tamsen Meek, MD

## 2023-05-06 DIAGNOSIS — Z170 Estrogen receptor positive status [ER+]: Secondary | ICD-10-CM | POA: Diagnosis not present

## 2023-05-06 DIAGNOSIS — C50511 Malignant neoplasm of lower-outer quadrant of right female breast: Secondary | ICD-10-CM | POA: Diagnosis not present

## 2023-05-13 LAB — SIGNATERA
SIGNATERA MTM READOUT: 0 MTM/ml
SIGNATERA TEST RESULT: NEGATIVE

## 2023-06-05 ENCOUNTER — Telehealth: Payer: Self-pay | Admitting: Adult Health

## 2023-06-05 NOTE — Telephone Encounter (Signed)
 Spoke wit pt about rescheduled appointment date and time.

## 2023-06-17 DIAGNOSIS — Z130 Encounter for screening for diseases of the blood and blood-forming organs and certain disorders involving the immune mechanism: Secondary | ICD-10-CM | POA: Diagnosis not present

## 2023-06-17 DIAGNOSIS — Z780 Asymptomatic menopausal state: Secondary | ICD-10-CM | POA: Diagnosis not present

## 2023-06-17 DIAGNOSIS — Z01419 Encounter for gynecological examination (general) (routine) without abnormal findings: Secondary | ICD-10-CM | POA: Diagnosis not present

## 2023-06-17 DIAGNOSIS — Z853 Personal history of malignant neoplasm of breast: Secondary | ICD-10-CM | POA: Diagnosis not present

## 2023-06-17 DIAGNOSIS — Z1389 Encounter for screening for other disorder: Secondary | ICD-10-CM | POA: Diagnosis not present

## 2023-06-17 DIAGNOSIS — Z124 Encounter for screening for malignant neoplasm of cervix: Secondary | ICD-10-CM | POA: Diagnosis not present

## 2023-06-19 ENCOUNTER — Encounter: Payer: 59 | Admitting: Adult Health

## 2023-06-19 ENCOUNTER — Other Ambulatory Visit: Payer: 59

## 2023-06-27 ENCOUNTER — Other Ambulatory Visit: Payer: Self-pay | Admitting: *Deleted

## 2023-06-27 DIAGNOSIS — C50511 Malignant neoplasm of lower-outer quadrant of right female breast: Secondary | ICD-10-CM

## 2023-06-30 ENCOUNTER — Inpatient Hospital Stay: Attending: Hematology and Oncology

## 2023-06-30 ENCOUNTER — Inpatient Hospital Stay (HOSPITAL_BASED_OUTPATIENT_CLINIC_OR_DEPARTMENT_OTHER): Admitting: Adult Health

## 2023-06-30 ENCOUNTER — Encounter: Payer: Self-pay | Admitting: Adult Health

## 2023-06-30 VITALS — BP 143/70 | HR 67 | Temp 98.1°F | Resp 18 | Ht 68.0 in | Wt 131.1 lb

## 2023-06-30 DIAGNOSIS — Z1732 Human epidermal growth factor receptor 2 negative status: Secondary | ICD-10-CM | POA: Diagnosis not present

## 2023-06-30 DIAGNOSIS — Z170 Estrogen receptor positive status [ER+]: Secondary | ICD-10-CM | POA: Diagnosis not present

## 2023-06-30 DIAGNOSIS — C50511 Malignant neoplasm of lower-outer quadrant of right female breast: Secondary | ICD-10-CM

## 2023-06-30 DIAGNOSIS — Z79811 Long term (current) use of aromatase inhibitors: Secondary | ICD-10-CM | POA: Insufficient documentation

## 2023-06-30 DIAGNOSIS — Z79899 Other long term (current) drug therapy: Secondary | ICD-10-CM | POA: Insufficient documentation

## 2023-06-30 DIAGNOSIS — Z1721 Progesterone receptor positive status: Secondary | ICD-10-CM | POA: Diagnosis not present

## 2023-06-30 DIAGNOSIS — N951 Menopausal and female climacteric states: Secondary | ICD-10-CM | POA: Diagnosis not present

## 2023-06-30 LAB — CMP (CANCER CENTER ONLY)
ALT: 16 U/L (ref 0–44)
AST: 21 U/L (ref 15–41)
Albumin: 4.2 g/dL (ref 3.5–5.0)
Alkaline Phosphatase: 32 U/L — ABNORMAL LOW (ref 38–126)
Anion gap: 6 (ref 5–15)
BUN: 21 mg/dL (ref 8–23)
CO2: 29 mmol/L (ref 22–32)
Calcium: 9.4 mg/dL (ref 8.9–10.3)
Chloride: 105 mmol/L (ref 98–111)
Creatinine: 0.96 mg/dL (ref 0.44–1.00)
GFR, Estimated: >60 mL/min (ref >60)
Glucose, Bld: 98 mg/dL (ref 70–99)
Potassium: 4.1 mmol/L (ref 3.5–5.1)
Sodium: 140 mmol/L (ref 135–145)
Total Bilirubin: 0.5 mg/dL (ref 0.0–1.2)
Total Protein: 6.4 g/dL — ABNORMAL LOW (ref 6.5–8.1)

## 2023-06-30 LAB — CBC WITH DIFFERENTIAL (CANCER CENTER ONLY)
Abs Immature Granulocytes: 0.03 10*3/uL (ref 0.00–0.07)
Basophils Absolute: 0 10*3/uL (ref 0.0–0.1)
Basophils Relative: 1 %
Eosinophils Absolute: 0.2 10*3/uL (ref 0.0–0.5)
Eosinophils Relative: 4 %
HCT: 37.4 % (ref 36.0–46.0)
Hemoglobin: 12.9 g/dL (ref 12.0–15.0)
Immature Granulocytes: 1 %
Lymphocytes Relative: 30 %
Lymphs Abs: 1.5 10*3/uL (ref 0.7–4.0)
MCH: 31.3 pg (ref 26.0–34.0)
MCHC: 34.5 g/dL (ref 30.0–36.0)
MCV: 90.8 fL (ref 80.0–100.0)
Monocytes Absolute: 0.4 10*3/uL (ref 0.1–1.0)
Monocytes Relative: 8 %
Neutro Abs: 2.9 10*3/uL (ref 1.7–7.7)
Neutrophils Relative %: 56 %
Platelet Count: 245 10*3/uL (ref 150–400)
RBC: 4.12 MIL/uL (ref 3.87–5.11)
RDW: 12.2 % (ref 11.5–15.5)
WBC Count: 5.1 10*3/uL (ref 4.0–10.5)
nRBC: 0 % (ref 0.0–0.2)

## 2023-06-30 NOTE — Progress Notes (Signed)
 SURVIVORSHIP VISIT:  BRIEF ONCOLOGIC HISTORY:  Oncology History  Malignant neoplasm of lower-outer quadrant of right breast of female, estrogen receptor positive (HCC)  10/16/2022 Initial Diagnosis   Palpable right breast mass posteriorly with calcifications.  The calcifications were benign.  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%, PR 100%, Ki67 10%, HER2 IHC negative   10/30/2022 Cancer Staging   Staging form: Breast, AJCC 8th Edition - Clinical: Stage IA (cT1b, cN0, cM0, G1, ER+, PR+, HER2-) - Signed by Cameron Cea, MD on 10/30/2022 Histologic grading system: 3 grade system   11/11/2022 Surgery   Right lumpectomy: Grade 1 IDC 0.1 cm, DCIS 0.8 cm, Marg negative LVI not identified, ER 95%, PR 100%, HER2 negative 1+ by IHC, Ki-67 10%   04/2023 -  Anti-estrogen oral therapy   1 mg Anastrozole      INTERVAL HISTORY:  Crystal Galvan to review her survivorship care plan detailing her treatment course for breast cancer, as well as monitoring long-term side effects of that treatment, education regarding health maintenance, screening, and overall wellness and health promotion.     Overall, Crystal Galvan reports feeling quite well.  She continues to struggle with hot flashes and is taking Effexor  which helps some but not as much as she had hoped.  She also wants to know if there are any recommendations alternative to a mammogram.  REVIEW OF SYSTEMS:  Review of Systems  Constitutional:  Negative for appetite change, chills, fatigue, fever and unexpected weight change.  HENT:   Negative for hearing loss, lump/mass and trouble swallowing.   Eyes:  Negative for eye problems and icterus.  Respiratory:  Negative for chest tightness, cough and shortness of breath.   Cardiovascular:  Negative for chest pain, leg swelling and palpitations.  Gastrointestinal:  Negative for abdominal distention, abdominal pain, constipation, diarrhea,  nausea and vomiting.  Endocrine: Negative for hot flashes.  Genitourinary:  Negative for difficulty urinating.   Musculoskeletal:  Negative for arthralgias.  Skin:  Negative for itching and rash.  Neurological:  Negative for dizziness, extremity weakness, headaches and numbness.  Hematological:  Negative for adenopathy. Does not bruise/bleed easily.  Psychiatric/Behavioral:  Negative for depression. The patient is not nervous/anxious.    Breast: Denies any new nodularity, masses, tenderness, nipple changes, or nipple discharge.       PAST MEDICAL/SURGICAL HISTORY:  Past Medical History:  Diagnosis Date   Breast cancer (HCC)    Complication of anesthesia    difficult being put to sleep   Coronary artery calcification    Depression    Headache(784.0)    occ   Hypothyroidism    Past Surgical History:  Procedure Laterality Date   BREAST BIOPSY Right 01/26/2020   BREAST BIOPSY Right 01/2019   BREAST BIOPSY Right 10/10/2022   US  RT BREAST BX W LOC DEV 1ST LESION IMG BX SPEC US  GUIDE 10/10/2022 [facility]   BREAST BIOPSY Right 10/10/2022   MM RT BREAST BX W LOC DEV 1ST LESION IMAGE BX SPEC STEREO GUIDE 10/10/2022 [facility]   BREAST BIOPSY Right 10/10/2022   MM RT BREAST BX W LOC DEV EA AD LESION IMG BX SPEC STEREO GUIDE 10/10/2022 [facility]   BREAST BIOPSY Right 10/16/2022   MM RT BREAST BX W LOC DEV 1ST LESION IMAGE BX SPEC STEREO GUIDE 10/16/2022 [facility]   BREAST LUMPECTOMY Right 11/11/2022   Procedure: RIGHT BREAST LUMPECTOMY;  Surgeon: Enid Harry, MD;  Location: [facility] OR;  Service: General;  Laterality: Right;   COLONOSCOPY WITH PROPOFOL  N/A 06/01/2012   Procedure: COLONOSCOPY WITH PROPOFOL ;  Surgeon: Garrett Kallman, MD;  Location: [facility];  Service: Endoscopy;  Laterality: N/A;   FACELIFT     KNEE ARTHROSCOPY  03/05/2007   Right   Nasal suirgery       ALLERGIES:  Allergies  Allergen Reactions   Erythromycin Other (See Comments) and  Rash    Other Reaction(s): stomach upset, nausea   Sulfa Antibiotics Other (See Comments)   Minocycline     Other Reaction(s): Unknown   Pollen Extract     Other Reaction(s): runny nose   Sulfamethoxazole-Trimethoprim     Other Reaction(s): intolerance-unknown     CURRENT MEDICATIONS:  Outpatient Encounter Medications as of 06/30/2023  Medication Sig Note   anastrozole  (ARIMIDEX ) 1 MG tablet Take 1 tablet (1 mg total) by mouth daily.    aspirin EC 81 MG tablet Take 81 mg by mouth daily. Swallow whole. 11/06/2022: On hold for procedure   Berberine Chloride 500 MG CAPS Take by mouth.    cholecalciferol (VITAMIN D) 1000 UNITS tablet Take 1,000 Units by mouth daily.    cimetidine (TAGAMET) 400 MG tablet Take 400 mg by mouth 2 (two) times daily.    dipyridamole (PERSANTINE) 75 MG tablet Take by mouth.    doxycycline (MONODOX) 100 MG capsule Take 100 mg by mouth 2 (two) times daily.    etodolac (LODINE) 300 MG capsule Take 300 mg by mouth 2 (two) times daily.    Fenbendazole POWD by Does not apply route.    loratadine (CLARITIN) 10 MG tablet Take 10 mg by mouth daily.    lovastatin (MEVACOR) 20 MG tablet Take 20 mg by mouth daily.    metFORMIN (GLUCOPHAGE) 500 MG tablet Take 500 mg by mouth 2 (two) times daily.    metoprolol  succinate (TOPROL -XL) 25 MG 24 hr tablet Take 25 mg by mouth daily.    thyroid  (ARMOUR) 30 MG tablet Take 30 mg by mouth daily before breakfast.    TRINTELLIX 10 MG TABS tablet Take 10 mg by mouth daily.    Turmeric (QC TUMERIC COMPLEX) 500 MG CAPS Take by mouth.    valACYclovir (VALTREX) 1000 MG tablet Take 1,000 mg by mouth daily.    venlafaxine  XR (EFFEXOR -XR) 37.5 MG 24 hr capsule Take 1 capsule (37.5 mg total) by mouth daily with breakfast.    No facility-administered encounter medications on file as of 06/30/2023.     ONCOLOGIC FAMILY HISTORY:  Family History  Problem Relation Age of Onset   Heart attack Mother    Breast cancer Neg Hx      SOCIAL  HISTORY:  Social History   Socioeconomic History   Marital status: Married    Spouse name: Not on file   Number of children: 2   Years of education: Not on file   Highest education level: Not on file  Occupational History   Not on file  Tobacco Use   Smoking status: Never   Smokeless tobacco: Never  Vaping Use   Vaping status: Never Used  Substance and Sexual Activity   Alcohol  use: Yes    Comment: Occasional   Drug use: No   Sexual activity: Not on file  Other Topics Concern   Not on file  Social History Narrative   Not on file   Social Drivers of Health   Financial Resource Strain: Not on file  Food Insecurity: No Food  Insecurity (12/05/2022)   Hunger Vital Sign    Worried About Running Out of Food in the Last Year: Never true    Ran Out of Food in the Last Year: Never true  Transportation Needs: No Transportation Needs (12/05/2022)   PRAPARE - Administrator, Civil Service (Medical): No    Lack of Transportation (Non-Medical): No  Physical Activity: Not on file  Stress: Not on file  Social Connections: Unknown (07/16/2021)   Received from [facility]   Social Network    Social Network: Not on file  Intimate Partner Violence: Not At Risk (12/05/2022)   Humiliation, Afraid, Rape, and Kick questionnaire    Fear of Current or Ex-Partner: No    Emotionally Abused: No    Physically Abused: No    Sexually Abused: No     OBSERVATIONS/OBJECTIVE:  BP (!) 143/70 (BP Location: Left Arm, Patient Position: Sitting)   Pulse 67   Temp 98.1 F (36.7 C) (Tympanic)   Resp 18   Ht 5\' 8"  (1.727 m)   Wt 131 lb 1.6 oz (59.5 kg)   SpO2 100%   BMI 19.93 kg/m  GENERAL: Patient is a well appearing female in no acute distress HEENT:  Sclerae anicteric.  Oropharynx clear and moist. No ulcerations or evidence of oropharyngeal candidiasis. Neck is supple.  NODES:  No cervical, supraclavicular, or axillary lymphadenopathy palpated.  BREAST EXAM: Right  breast status postlumpectomy no sign of local recurrence left breast is benign LUNGS:  Clear to auscultation bilaterally.  No wheezes or rhonchi. HEART:  Regular rate and rhythm. No murmur appreciated. ABDOMEN:  Soft, nontender.  Positive, normoactive bowel sounds. No organomegaly palpated. MSK:  No focal spinal tenderness to palpation. Full range of motion bilaterally in the upper extremities. EXTREMITIES:  No peripheral edema.   SKIN:  Clear with no obvious rashes or skin changes. No nail dyscrasia. NEURO:  Nonfocal. Well oriented.  Appropriate affect.   LABORATORY DATA:  None for this visit.  DIAGNOSTIC IMAGING:  None for this visit.      ASSESSMENT AND PLAN:  Ms.. Galvan is a pleasant 73 y.o. female with Stage IA right breast invasive ductal carcinoma, ER+/PR+/HER2-, diagnosed in 10/2022, treated with lumpectomy and has opted to forego antiestrogen therapy with anastrozole  for the time being..  She presents to the Survivorship Clinic for our initial meeting and routine follow-up post-completion of treatment for breast cancer.    1. Stage IA right/left breast cancer:  Crystal Galvan is continuing to recover from definitive treatment for breast cancer. She will follow-up with her medical oncologist, Dr.  Lee Public in 6 months with history and physical exam per surveillance protocol.  I reviewed her mammogram recommendations with her.  There is no alternative at this point and it remains the standard recommendation to complete annually from our breast operations group consistent with national guidelines.  I recommended continued annual mammograms however she does not want to schedule at this point she wants to do some more research and she will let me know.    Today, a comprehensive survivorship care plan and treatment summary was reviewed with the patient today detailing her breast cancer diagnosis, treatment course, potential late/long-term effects of treatment, appropriate follow-up care with  recommendations for the future, and patient education resources.  A copy of this summary, along with a letter will be sent to the patient's primary care provider via mail/fax/In Basket message after today's visit.    2.  Hot flashes: We discussed  the potential of flaxseed or a serving of soy milk a day.  She continues on Effexor  which is helping slightly.  We also discussed Veozah, I put information in her after visit summary about the medication since estrogen replacement therapy is contraindicated due to her history of estrogen positive breast cancer.  3. Bone health:  She was given education on specific activities to promote bone health.  4. Cancer screening:  Due to Crystal Galvan's history and her age, she should receive screening for skin cancers, colon cancer, and gynecologic cancers.  The information and recommendations are listed on the patient's comprehensive care plan/treatment summary and were reviewed in detail with the patient.    5. Health maintenance and wellness promotion: Crystal Galvan was encouraged to consume 5-7 servings of fruits and vegetables per day. We reviewed the "Nutrition Rainbow" handout.  She was also encouraged to engage in moderate to vigorous exercise for 30 minutes per day most days of the week.  She was instructed to limit her alcohol  consumption and continue to abstain from tobacco use.     6. Support services/counseling: It is not uncommon for this period of the patient's cancer care trajectory to be one of many emotions and stressors.   She was given information regarding our available services and encouraged to contact me with any questions or for help enrolling in any of our support group/programs.    Follow up instructions:    -Return to cancer center in 6 months for f/u with Dr. Lee Public -Mammogram due in 10/2023 -She is welcome to return back to the Survivorship Clinic at any time; no additional follow-up needed at this time.  -Consider referral back to survivorship  as a long-term survivor for continued surveillance  The patient was provided an opportunity to ask questions and all were answered. The patient agreed with the plan and demonstrated an understanding of the instructions.   Total encounter time: 45 minutes*in face-to-face visit time, chart review, lab review, care coordination, order entry, and documentation of the encounter time.  Alwin Baars, NP 06/30/23 10:36 AM Medical Oncology and Hematology [facility] [address] [place], [place] [address] Tel. [phone]    Fax. [phone]  *Total Encounter Time as defined by the Centers for Medicare and Medicaid Services includes, in addition to the face-to-face time of a patient visit (documented in the note above) non-face-to-face time: obtaining and reviewing outside history, ordering and reviewing medications, tests or procedures, care coordination (communications with other health care professionals or caregivers) and documentation in the medical record.

## 2023-06-30 NOTE — Patient Instructions (Signed)
Fezolinetant Tablets What is this medication? FEZOLINETANT (FEZ oh LIN e tant) reduces the number and severity of hot flashes due to menopause. It works by blocking substances in your body that cause hot flashes and night sweats. This medicine may be used for other purposes; ask your health care provider or pharmacist if you have questions. COMMON BRAND NAME(S): VEOZAH What should I tell my care team before I take this medication? They need to know if you have any of these conditions: Kidney disease Liver disease An unusual or allergic reaction to fezolinetant, other medications, foods, dyes, or preservatives Pregnant or trying to get pregnant Breastfeeding How should I use this medication? Take this medication by mouth with water. Take it as directed on the prescription label at the same time every day. Do not cut, crush, or chew this medication. Swallow the tablets whole. You can take it with or without food. If it upsets your stomach, take it with food. Keep taking it unless your care team tells you to stop. Talk to your care team about the use of this medication in children. Special care may be needed. Overdosage: If you think you have taken too much of this medicine contact a poison control center or emergency room at once. NOTE: This medicine is only for you. Do not share this medicine with others. What if I miss a dose? If you miss a dose, take it as soon as you can unless it is more than 12 hours late. If it is more than 12 hours late, skip the missed dose. Take the next dose at the normal time. What may interact with this medication? Other medications may affect the way this medication works. Talk with your care team about all of the medications you take. They may suggest changes to your treatment plan to lower the risk of side effects and to make sure your medications work as intended. This list may not describe all possible interactions. Give your health care provider a list of all  the medicines, herbs, non-prescription drugs, or dietary supplements you use. Also tell them if you smoke, drink alcohol, or use illegal drugs. Some items may interact with your medicine. What should I watch for while using this medication? Visit your care team for regular checks on your progress. Tell your care team if your symptoms do not start to get better or if they get worse. You may need blood work while taking this medication. What side effects may I notice from receiving this medication? Side effects that you should report to your care team as soon as possible: Allergic reactions--skin rash, itching, hives, swelling of the face, lips, tongue, or throat Liver injury--right upper belly pain, loss of appetite, nausea, light-colored stool, dark yellow or brown urine, yellowing skin or eyes, unusual weakness or fatigue Side effects that usually do not require medical attention (report these to your care team if they continue or are bothersome): Back pain Diarrhea Hot flashes Stomach pain Trouble sleeping This list may not describe all possible side effects. Call your doctor for medical advice about side effects. You may report side effects to FDA at 1-800-FDA-1088. Where should I keep my medication? Keep out of the reach of children and pets. Store at room temperature between 20 and 25 degrees C (68 and 77 degrees F). Get rid of any unused medication after the expiration date. To get rid of medications that are no longer needed or have expired: Take the medication to a medication take-back program. Check with   your pharmacy or law enforcement to find a location. If you cannot return the medication, check the label or package insert to see if the medication should be thrown out in the garbage or flushed down the toilet. If you are not sure, ask your care team. If it is safe to put it in the trash, take the medication out of the container. Mix the medication with cat litter, dirt, coffee  grounds, or other unwanted substance. Seal the mixture in a bag or container. Put it in the trash. NOTE: This sheet is a summary. It may not cover all possible information. If you have questions about this medicine, talk to your doctor, pharmacist, or health care provider.  2024 Elsevier/Gold Standard (2021-07-26 00:00:00)

## 2023-07-09 DIAGNOSIS — E559 Vitamin D deficiency, unspecified: Secondary | ICD-10-CM | POA: Diagnosis not present

## 2023-07-09 DIAGNOSIS — E785 Hyperlipidemia, unspecified: Secondary | ICD-10-CM | POA: Diagnosis not present

## 2023-07-09 DIAGNOSIS — E039 Hypothyroidism, unspecified: Secondary | ICD-10-CM | POA: Diagnosis not present

## 2023-07-09 DIAGNOSIS — E041 Nontoxic single thyroid nodule: Secondary | ICD-10-CM | POA: Diagnosis not present

## 2023-07-14 DIAGNOSIS — J340 Abscess, furuncle and carbuncle of nose: Secondary | ICD-10-CM | POA: Diagnosis not present

## 2023-07-14 DIAGNOSIS — R030 Elevated blood-pressure reading, without diagnosis of hypertension: Secondary | ICD-10-CM | POA: Diagnosis not present

## 2023-07-14 DIAGNOSIS — F419 Anxiety disorder, unspecified: Secondary | ICD-10-CM | POA: Diagnosis not present

## 2023-07-14 DIAGNOSIS — E785 Hyperlipidemia, unspecified: Secondary | ICD-10-CM | POA: Diagnosis not present

## 2023-07-14 DIAGNOSIS — F330 Major depressive disorder, recurrent, mild: Secondary | ICD-10-CM | POA: Diagnosis not present

## 2023-07-14 DIAGNOSIS — R232 Flushing: Secondary | ICD-10-CM | POA: Diagnosis not present

## 2023-07-14 DIAGNOSIS — C50911 Malignant neoplasm of unspecified site of right female breast: Secondary | ICD-10-CM | POA: Diagnosis not present

## 2023-07-14 DIAGNOSIS — R0609 Other forms of dyspnea: Secondary | ICD-10-CM | POA: Diagnosis not present

## 2023-07-14 DIAGNOSIS — I700 Atherosclerosis of aorta: Secondary | ICD-10-CM | POA: Diagnosis not present

## 2023-07-14 DIAGNOSIS — R931 Abnormal findings on diagnostic imaging of heart and coronary circulation: Secondary | ICD-10-CM | POA: Diagnosis not present

## 2023-07-14 DIAGNOSIS — R82998 Other abnormal findings in urine: Secondary | ICD-10-CM | POA: Diagnosis not present

## 2023-07-14 DIAGNOSIS — I471 Supraventricular tachycardia, unspecified: Secondary | ICD-10-CM | POA: Diagnosis not present

## 2023-07-14 DIAGNOSIS — E039 Hypothyroidism, unspecified: Secondary | ICD-10-CM | POA: Diagnosis not present

## 2023-07-28 NOTE — Progress Notes (Deleted)
 Cardiology Office Note:    Date:  07/28/2023   ID:  RENELLA STEIG, DOB 03/15/1950, MRN [id number]  PCP:  Jeannine Milroy., MD  Cardiologist:  Alexandria Angel, MD { Click to update primary MD,subspecialty MD or APP then REFRESH:1}    Referring MD: Jeannine Milroy., MD   Chief Complaint: follow-up of CAD and palpitations   History of Present Illness:    Crystal Galvan is a 73 y.o. female with a history of moderate non-obstructive CAD noted on coronary CTA in 07/2022, palpitations with short runs of paroxysmal SVT and rare PACs/ PVCs noted on monitor in 08/2022, mild mitral regurgitation, hyperlipidemia, hypothyroidism, breast cancer, and depression who is followed by Dr. Audery Blazing and presents today for routine follow-up.   Patient was referred to Dr. Audery Blazing in 07/2022 for further evaluation of coronary artery calcifications. Prior Echo in 05/2021 showed LVEF of 60-65% with grade 1 diastolic dysfunction and mild MR. Coronary calcium score in 06/2021 was elevated at 225 (83rd percentile). At this visit, patient complained of some intermittent chest heaviness and increased dyspnea on exertion. She also reported intermittent palpitations that she described as a fluttering sensation. Coronary CTA and 7 day Zio monitor were ordered for further evaluation. Coronary CTA showed a coronary calcium score of 246 (83rd percentile for age and sex) and moderate non-obstructive CAD. Zio monitor showed 36 short runs of SVT (longest run 16 beats) and rare PACS/ PVCs. She was started on Aspirin but patient declined statin.  Patient presents today for follow-up. ***  Non-Obstructive CAD Coronary CTA in 07/2022 showed a coronary calcium score of 246 (83rd percentile for age and sex) and moderate non-obstructive CAD. Zio monitor showed 36 short runs of SVT (longest run 16 beats) and rare PACS/ PVCs.  - No chest pain. *** - Continue Aspirin 81mg  daily. - Continue Lovastatin 20mg  daily.  Palpitations Paroxysmal  SVT Patient has a history of palpitations with brief runs of SVT and rare PACs/ PVCs noted on monitor in ***. - *** - Continue Toprol -XL 25mg  daily.  Mild Mitral Regurgitation Noted on Echo in 05/2021. - Can continue routine monitoring with serial Echos. Next Echo in 05/2024.   Hypertension BP well controlled. *** - Continue Toprol -XL 25mg  daily.  Hyperlipidemia Lipid panel in 09/2022: Total Cholesterol 207, Triglycerides 81, HDL 79, LDL 114. LDL goal <70 given CAD. - She has previously declined high-intensity statin. Continue Lovastatin 20mg  daily.  ***  EKGs/Labs/Other Studies Reviewed:    The following studies were reviewed:  Echocaridogram 05/17/2021: Impressions:  1. Left ventricular ejection fraction, by estimation, is 60 to 65%. The  left ventricle has normal function. The left ventricle has no regional  wall motion abnormalities. Left ventricular diastolic parameters are  consistent with Grade I diastolic  dysfunction (impaired relaxation).   2. Right ventricular systolic function is normal. The right ventricular  size is normal. There is normal pulmonary artery systolic pressure. The  estimated right ventricular systolic pressure is 34.0 mmHg.   3. The mitral valve is abnormal. Mild mitral valve regurgitation.   4. The aortic valve is tricuspid. Aortic valve regurgitation is not  visualized. Aortic valve sclerosis is present, with no evidence of aortic  valve stenosis.   5. The inferior vena cava is normal in size with <50% respiratory  variability, suggesting right atrial pressure of 8 mmHg.   Comparison(s): No prior Echocardiogram.  _______________  Coronary CTA 07/18/2022: Impressions: 1. Coronary calcium score of 246. This was 83 percentile for  age and sex matched control. 2. Normal coronary origin with right dominance.  3. CAD-RADS 3. Moderate stenosis. Consider symptom-guided anti-ischemic pharmacotherapy as well as risk factor modification per guideline  directed care. Additional analysis with CT FFR will be submitted. _______________  Monitor 08/05/2022 to 08/13/2022: Patient had a min HR of 47 bpm, max HR of 169 bpm, and avg HR of 64 bpm. Predominant underlying rhythm was Sinus Rhythm. 36 Supraventricular Tachycardia runs occurred, the run with the fastest interval lasting 4 beats with a max rate of 169 bpm, the  longest lasting 16 beats with an avg rate of 110 bpm. Some episodes of Supraventricular Tachycardia may be possible Atrial Tachycardia with variable block. Supraventricular Tachycardia was detected within +/- 45 seconds of symptomatic patient event(s).  Isolated SVEs were rare (<1.0%), SVE Couplets were rare (<1.0%), and SVE Triplets were rare (<1.0%). Isolated VEs were rare (<1.0%), and no VE Couplets or VE Triplets were present. Ventricular Trigeminy was present.   Sinus bradycardia, NSR, sinus tachycardia, PACs, brief runs of SVT and rare PVC  EKG:  EKG ordered today. EKG personally reviewed and demonstrates ***.  Recent Labs: 06/30/2023: ALT 16; BUN 21; Creatinine 0.96; Hemoglobin 12.9; Platelet Count 245; Potassium 4.1; Sodium 140  Recent Lipid Panel    Component Value Date/Time   CHOL 207 (H) 09/20/2022 0837   TRIG 81 09/20/2022 0837   HDL 79 09/20/2022 0837   CHOLHDL 2.6 09/20/2022 0837   LDLCALC 114 (H) 09/20/2022 0837    Physical Exam:    Vital Signs: There were no vitals taken for this visit.    Wt Readings from Last 3 Encounters:  06/30/23 131 lb 1.6 oz (59.5 kg)  02/18/23 132 lb (59.9 kg)  12/05/22 128 lb 11.2 oz (58.4 kg)     General: 73 y.o. female in no acute distress. HEENT: Normocephalic and atraumatic. Sclera clear.  Neck: Supple. No carotid bruits. No JVD. Heart: *** RRR. Distinct S1 and S2. No murmurs, gallops, or rubs.  Lungs: No increased work of breathing. Clear to ausculation bilaterally. No wheezes, rhonchi, or rales.  Abdomen: Soft, non-distended, and non-tender to palpation.  Extremities: No  lower extremity edema.  Radial and distal pedal pulses 2+ and equal bilaterally. Skin: Warm and dry. Neuro: No focal deficits. Psych: Normal affect. Responds appropriately.   Assessment:    No diagnosis found.  Plan:     Disposition: Follow up in ***   Signed, Casimer Clear, PA-C  07/28/2023 11:06 AM    [facility]

## 2023-08-05 ENCOUNTER — Ambulatory Visit: Admitting: Student

## 2023-08-07 ENCOUNTER — Ambulatory Visit: Admitting: Student

## 2023-08-12 DIAGNOSIS — E559 Vitamin D deficiency, unspecified: Secondary | ICD-10-CM | POA: Diagnosis not present

## 2023-08-12 DIAGNOSIS — E039 Hypothyroidism, unspecified: Secondary | ICD-10-CM | POA: Diagnosis not present

## 2023-08-12 DIAGNOSIS — N951 Menopausal and female climacteric states: Secondary | ICD-10-CM | POA: Diagnosis not present

## 2023-08-12 DIAGNOSIS — C50911 Malignant neoplasm of unspecified site of right female breast: Secondary | ICD-10-CM | POA: Diagnosis not present

## 2023-08-12 DIAGNOSIS — Z131 Encounter for screening for diabetes mellitus: Secondary | ICD-10-CM | POA: Diagnosis not present

## 2023-08-25 ENCOUNTER — Encounter: Payer: Self-pay | Admitting: Hematology and Oncology

## 2023-08-25 LAB — SIGNATERA
SIGNATERA MTM READOUT: 0 MTM/ml
SIGNATERA TEST RESULT: NEGATIVE

## 2023-08-28 ENCOUNTER — Other Ambulatory Visit: Payer: Self-pay | Admitting: Internal Medicine

## 2023-08-28 DIAGNOSIS — Z853 Personal history of malignant neoplasm of breast: Secondary | ICD-10-CM

## 2023-08-29 NOTE — Progress Notes (Signed)
 Cardiology Office Note:    Date:  08/29/2023   ID:  Crystal Galvan, DOB 1950/09/15, MRN [id number]  PCP:  Loreli Elsie JONETTA Mickey., MD  Cardiologist:  Redell Shallow, MD     Referring MD: Loreli Elsie JONETTA Mickey., MD   Chief Complaint: follow-up of CAD and palpitations   History of Present Illness:    Crystal Galvan is a 73 y.o. female with a history of moderate non-obstructive CAD noted on coronary CTA in 07/2022, palpitations with short runs of paroxysmal SVT and rare PACs/ PVCs noted on monitor in 08/2022, mild mitral regurgitation, hyperlipidemia, hypothyroidism, breast cancer, and depression who is followed by Dr. Shallow and presents today for routine follow-up.   Patient was referred to Dr. Shallow in 07/2022 for further evaluation of coronary artery calcifications. Prior Echo in 05/2021 showed LVEF of 60-65% with grade 1 diastolic dysfunction and mild MR. Coronary calcium score in 06/2021 was elevated at 225 (83rd percentile). At this visit, patient complained of some intermittent chest heaviness and increased dyspnea on exertion. She also reported intermittent palpitations that she described as a fluttering sensation. Coronary CTA and 7 day Zio monitor were ordered for further evaluation. Coronary CTA showed a coronary calcium score of 246 (83rd percentile for age and sex) and moderate non-obstructive CAD. Zio monitor showed 36 short runs of SVT (longest run 16 beats) and rare PACS/ PVCs. She was started on Aspirin but patient declined high intensity statin.  Patient presents today for follow-up.  Reports that she has been doing well from a cardiovascular standpoint.  She denies chest pain.  She stays active by playing pickle ball, walking her dogs, going to the gym 2 times per week.  Denies chest pain with activity.  Sometimes notices that she will get short of breath when going up an incline but denies shortness of breath otherwise.  Denies dizziness, syncope, near syncope.  She does have occasional  palpitations.  She is more aware of these when she is laying in bed at night.  Episodes are very brief and are not sustained.  She follows with [facility].  On metformin for history of breast cancer, does not have diabetes or prediabetes.  She follows a healthy diet, currently following keto diet.  She is not interested in starting statin therapy. Does not smoke   EKGs/Labs/Other Studies Reviewed:    The following studies were reviewed:  Echocaridogram 05/17/2021: Impressions:  1. Left ventricular ejection fraction, by estimation, is 60 to 65%. The  left ventricle has normal function. The left ventricle has no regional  wall motion abnormalities. Left ventricular diastolic parameters are  consistent with Grade I diastolic  dysfunction (impaired relaxation).   2. Right ventricular systolic function is normal. The right ventricular  size is normal. There is normal pulmonary artery systolic pressure. The  estimated right ventricular systolic pressure is 34.0 mmHg.   3. The mitral valve is abnormal. Mild mitral valve regurgitation.   4. The aortic valve is tricuspid. Aortic valve regurgitation is not  visualized. Aortic valve sclerosis is present, with no evidence of aortic  valve stenosis.   5. The inferior vena cava is normal in size with <50% respiratory  variability, suggesting right atrial pressure of 8 mmHg.   Comparison(s): No prior Echocardiogram.  _______________  Coronary CTA 07/18/2022: Impressions: 1. Coronary calcium score of 246. This was 77 percentile for age and sex matched control. 2. Normal coronary origin with right dominance.  3. CAD-RADS 3. Moderate stenosis. Consider  symptom-guided anti-ischemic pharmacotherapy as well as risk factor modification per guideline directed care. Additional analysis with CT FFR will be submitted. _______________  Monitor 08/05/2022 to 08/13/2022: Patient had a min HR of 47 bpm, max HR of 169 bpm, and avg HR of 64 bpm.  Predominant underlying rhythm was Sinus Rhythm. 36 Supraventricular Tachycardia runs occurred, the run with the fastest interval lasting 4 beats with a max rate of 169 bpm, the  longest lasting 16 beats with an avg rate of 110 bpm. Some episodes of Supraventricular Tachycardia may be possible Atrial Tachycardia with variable block. Supraventricular Tachycardia was detected within +/- 45 seconds of symptomatic patient event(s).  Isolated SVEs were rare (<1.0%), SVE Couplets were rare (<1.0%), and SVE Triplets were rare (<1.0%). Isolated VEs were rare (<1.0%), and no VE Couplets or VE Triplets were present. Ventricular Trigeminy was present.   Sinus bradycardia, NSR, sinus tachycardia, PACs, brief runs of SVT and rare PVC  EKG:  EKG ordered today. EKG personally reviewed and demonstrates Normal sinus rhythm with PVC present.  Recent Labs: 06/30/2023: ALT 16; BUN 21; Creatinine 0.96; Hemoglobin 12.9; Platelet Count 245; Potassium 4.1; Sodium 140  Recent Lipid Panel    Component Value Date/Time   CHOL 207 (H) 09/20/2022 0837   TRIG 81 09/20/2022 0837   HDL 79 09/20/2022 0837   CHOLHDL 2.6 09/20/2022 0837   LDLCALC 114 (H) 09/20/2022 0837    Physical Exam:    Vital Signs: There were no vitals taken for this visit.    Wt Readings from Last 3 Encounters:  06/30/23 131 lb 1.6 oz (59.5 kg)  02/18/23 132 lb (59.9 kg)  12/05/22 128 lb 11.2 oz (58.4 kg)     General: 73 y.o. female in no acute distress.  Neck: Supple. No JVD. Heart:  RRR. Distinct S1 and S2. No murmurs, gallops, or rubs.  Radial pulses 2+ bilaterally Lungs: No increased work of breathing. Clear to ausculation bilaterally. No wheezes, rhonchi, or rales.  Abdomen: Soft, non-distended, and non-tender to palpation.  Extremities: No lower extremity edema.  Skin: Warm and dry. Neuro: No focal deficits. Psych: Normal affect. Responds appropriately.   Assessment:    No diagnosis found.  Plan:    Non-Obstructive CAD -  Coronary CTA in 07/2022 showed a coronary calcium score of 246 (83rd percentile for age and sex) and moderate non-obstructive CAD. Zio monitor showed 36 short runs of SVT (longest run 16 beats) and rare PACS/ PVCs.  - Reviewed results of previous studies  - EKG today without ischemic changes  - No chest pain or DOE. Stays active by playing pickle ball, walking her dogs, going to gym 2x per week  - Encouraged her to continue to be active. She follows a healthy diet, currently doing Keto. Discussed her risk factors for CAD including family history, HLD. She does not have diabetes, does not smoke, BP well controlled.  - Declines statins therapy   Palpitations Paroxysmal SVT - Patient has a history of palpitations with brief runs of SVT and rare PACs/ PVCs noted on monitor in 08/2022. - Continues to have mild, brief palpitations. Most notable when laying in bed at night. Discussed increasing metoprolol  for improved palpitations control, but her symptoms are mild so we decided to continue current treatment  - Continue Toprol -XL 25mg  daily.  - Discussed staying hydrated and limiting caffeine use   Mild Mitral Regurgitation - Noted on Echo in 05/2021. - Can continue routine monitoring with serial Echos. Next Echo in 05/2024.  Hypertension - BP well controlled. No dizziness  - Continue Toprol -XL 25mg  daily.  Hyperlipidemia - Lipid panel in 07/2023 showed LDL 136, HDL 67, triglycerides 79, total cholesterol 219.  LDL goal <70 given CAD. - Discussed recommendations for statin therapy but Patient declines at this time. Discussed low cholesterol diets   Disposition: Follow up in 12 months    Signed, Rollo FABIENE Louder, PA-C  08/29/2023 1:15 PM    [facility]

## 2023-09-10 ENCOUNTER — Encounter: Payer: Self-pay | Admitting: Cardiology

## 2023-09-10 ENCOUNTER — Ambulatory Visit: Attending: Cardiovascular Disease | Admitting: Cardiology

## 2023-09-10 VITALS — BP 114/80 | HR 70 | Ht 68.0 in | Wt 133.4 lb

## 2023-09-10 DIAGNOSIS — R002 Palpitations: Secondary | ICD-10-CM

## 2023-09-10 DIAGNOSIS — I2510 Atherosclerotic heart disease of native coronary artery without angina pectoris: Secondary | ICD-10-CM | POA: Diagnosis not present

## 2023-09-10 DIAGNOSIS — I340 Nonrheumatic mitral (valve) insufficiency: Secondary | ICD-10-CM | POA: Diagnosis not present

## 2023-09-16 ENCOUNTER — Encounter: Payer: Self-pay | Admitting: Hematology and Oncology

## 2023-09-16 DIAGNOSIS — C50511 Malignant neoplasm of lower-outer quadrant of right female breast: Secondary | ICD-10-CM

## 2023-09-22 ENCOUNTER — Encounter: Payer: Self-pay | Admitting: Hematology and Oncology

## 2023-10-02 DIAGNOSIS — H6591 Unspecified nonsuppurative otitis media, right ear: Secondary | ICD-10-CM | POA: Diagnosis not present

## 2023-10-10 DIAGNOSIS — E039 Hypothyroidism, unspecified: Secondary | ICD-10-CM | POA: Diagnosis not present

## 2023-10-10 DIAGNOSIS — N951 Menopausal and female climacteric states: Secondary | ICD-10-CM | POA: Diagnosis not present

## 2023-10-14 ENCOUNTER — Other Ambulatory Visit: Payer: Self-pay | Admitting: *Deleted

## 2023-10-14 ENCOUNTER — Encounter: Payer: Self-pay | Admitting: Hematology and Oncology

## 2023-10-14 DIAGNOSIS — C50511 Malignant neoplasm of lower-outer quadrant of right female breast: Secondary | ICD-10-CM

## 2023-10-14 DIAGNOSIS — Z170 Estrogen receptor positive status [ER+]: Secondary | ICD-10-CM

## 2023-10-14 DIAGNOSIS — F32A Depression, unspecified: Secondary | ICD-10-CM | POA: Diagnosis not present

## 2023-10-14 DIAGNOSIS — N951 Menopausal and female climacteric states: Secondary | ICD-10-CM | POA: Diagnosis not present

## 2023-10-14 DIAGNOSIS — C50911 Malignant neoplasm of unspecified site of right female breast: Secondary | ICD-10-CM | POA: Diagnosis not present

## 2023-10-16 DIAGNOSIS — H04123 Dry eye syndrome of bilateral lacrimal glands: Secondary | ICD-10-CM | POA: Diagnosis not present

## 2023-10-16 DIAGNOSIS — H524 Presbyopia: Secondary | ICD-10-CM | POA: Diagnosis not present

## 2023-10-16 DIAGNOSIS — H25013 Cortical age-related cataract, bilateral: Secondary | ICD-10-CM | POA: Diagnosis not present

## 2023-10-16 DIAGNOSIS — H52203 Unspecified astigmatism, bilateral: Secondary | ICD-10-CM | POA: Diagnosis not present

## 2023-10-16 DIAGNOSIS — H2513 Age-related nuclear cataract, bilateral: Secondary | ICD-10-CM | POA: Diagnosis not present

## 2023-10-21 ENCOUNTER — Encounter

## 2023-10-22 ENCOUNTER — Ambulatory Visit
Admission: RE | Admit: 2023-10-22 | Discharge: 2023-10-22 | Disposition: A | Discharge status: Home / Self Care | Source: Ambulatory Visit | Attending: Internal Medicine | Admitting: Internal Medicine

## 2023-10-22 DIAGNOSIS — Z853 Personal history of malignant neoplasm of breast: Secondary | ICD-10-CM

## 2023-10-22 DIAGNOSIS — R928 Other abnormal and inconclusive findings on diagnostic imaging of breast: Secondary | ICD-10-CM | POA: Diagnosis not present

## 2023-10-29 DIAGNOSIS — C50511 Malignant neoplasm of lower-outer quadrant of right female breast: Secondary | ICD-10-CM | POA: Diagnosis not present

## 2023-10-29 DIAGNOSIS — Z170 Estrogen receptor positive status [ER+]: Secondary | ICD-10-CM | POA: Diagnosis not present

## 2023-11-06 DIAGNOSIS — D1801 Hemangioma of skin and subcutaneous tissue: Secondary | ICD-10-CM | POA: Diagnosis not present

## 2023-11-06 DIAGNOSIS — L570 Actinic keratosis: Secondary | ICD-10-CM | POA: Diagnosis not present

## 2023-11-06 DIAGNOSIS — D485 Neoplasm of uncertain behavior of skin: Secondary | ICD-10-CM | POA: Diagnosis not present

## 2023-11-06 DIAGNOSIS — D0462 Carcinoma in situ of skin of left upper limb, including shoulder: Secondary | ICD-10-CM | POA: Diagnosis not present

## 2023-11-06 DIAGNOSIS — L821 Other seborrheic keratosis: Secondary | ICD-10-CM | POA: Diagnosis not present

## 2023-11-06 DIAGNOSIS — Z85828 Personal history of other malignant neoplasm of skin: Secondary | ICD-10-CM | POA: Diagnosis not present

## 2023-11-07 LAB — SIGNATERA ONLY (NATERA MANAGED)
SIGNATERA MTM READOUT: 0 MTM/ml
SIGNATERA TEST RESULT: NEGATIVE

## 2023-11-17 DIAGNOSIS — M5416 Radiculopathy, lumbar region: Secondary | ICD-10-CM | POA: Diagnosis not present

## 2023-12-02 DIAGNOSIS — M545 Low back pain, unspecified: Secondary | ICD-10-CM | POA: Diagnosis not present

## 2023-12-02 DIAGNOSIS — M546 Pain in thoracic spine: Secondary | ICD-10-CM | POA: Diagnosis not present

## 2023-12-05 DIAGNOSIS — M5416 Radiculopathy, lumbar region: Secondary | ICD-10-CM | POA: Diagnosis not present

## 2023-12-08 ENCOUNTER — Other Ambulatory Visit: Payer: Self-pay | Admitting: Orthopedic Surgery

## 2023-12-08 DIAGNOSIS — M5416 Radiculopathy, lumbar region: Secondary | ICD-10-CM

## 2023-12-15 ENCOUNTER — Telehealth: Payer: Self-pay | Admitting: Cardiology

## 2023-12-15 NOTE — Telephone Encounter (Signed)
 Spoke with pt. Advised pt I had sent Rollo Louder, PA a message with her request. Pt stated understanding and thanks.

## 2023-12-15 NOTE — Telephone Encounter (Signed)
 Pt is requesting an order be put in for a stress test. Please advise

## 2023-12-15 NOTE — Telephone Encounter (Signed)
 Left message to call back.

## 2023-12-15 NOTE — Telephone Encounter (Signed)
 Spoke with pt. Pt would like to come in and discuss getting a stress test. Appt set for 11/6.

## 2023-12-16 NOTE — Discharge Instructions (Signed)
 Post Procedure Spinal Discharge Instruction Sheet  You may resume a regular diet and any medications that you routinely take (including pain medications) unless otherwise noted by MD.    No driving day of procedure.  Light activity throughout the rest of the day.  Do not do any strenuous work, exercise, bending or lifting.  The day following the procedure, you can resume normal physical activity but you should refrain from exercising or physical therapy for at least three days thereafter.  You may apply ice to the injection site, 20 minutes on, 20 minutes off, as needed. Do not apply ice directly to skin.    Common Side Effects:  Headaches- take your usual medications as directed by your physician.  Increase your fluid intake.  Caffeinated beverages may be helpful.  Lie flat in bed until your headache resolves.  Restlessness or inability to sleep- you may have trouble sleeping for the next few days.  Ask your referring physician if you need any medication for sleep.  Facial flushing or redness- should subside within a few days.  Increased pain- a temporary increase in pain a day or two following your procedure is not unusual.  Take your pain medication as prescribed by your referring physician.  Leg cramps  Please contact our office at [phone] for the following symptoms: Fever greater than 100 degrees. Headaches unresolved with medication after 2-3 days. Increased swelling, pain, or redness at injection site.   Thank you for visiting [facility] today!   You May Restart Your Aspirin Today

## 2023-12-17 ENCOUNTER — Ambulatory Visit
Admission: RE | Admit: 2023-12-17 | Discharge: 2023-12-17 | Disposition: A | Discharge status: Home / Self Care | Source: Ambulatory Visit | Attending: Orthopedic Surgery | Admitting: Orthopedic Surgery

## 2023-12-17 DIAGNOSIS — M5416 Radiculopathy, lumbar region: Secondary | ICD-10-CM

## 2023-12-17 MED ORDER — METHYLPREDNISOLONE ACETATE 40 MG/ML INJ SUSP (RADIOLOG
80.0000 mg | Freq: Once | INTRAMUSCULAR | Status: AC
  Administered 2023-12-17: 80 mg via EPIDURAL

## 2023-12-17 MED ORDER — IOPAMIDOL (ISOVUE-M 200) INJECTION 41%
1.0000 mL | Freq: Once | INTRAMUSCULAR | Status: AC
  Administered 2023-12-17: 1 mL via EPIDURAL

## 2023-12-23 ENCOUNTER — Inpatient Hospital Stay: Attending: Hematology and Oncology | Admitting: Hematology and Oncology

## 2023-12-23 VITALS — BP 122/78 | HR 59 | Temp 98.2°F | Resp 18 | Ht 68.0 in | Wt 131.3 lb

## 2023-12-23 DIAGNOSIS — Z170 Estrogen receptor positive status [ER+]: Secondary | ICD-10-CM | POA: Diagnosis not present

## 2023-12-23 DIAGNOSIS — C50511 Malignant neoplasm of lower-outer quadrant of right female breast: Secondary | ICD-10-CM

## 2023-12-23 DIAGNOSIS — Z79811 Long term (current) use of aromatase inhibitors: Secondary | ICD-10-CM | POA: Insufficient documentation

## 2023-12-23 DIAGNOSIS — Z17411 Hormone receptor positive with human epidermal growth factor receptor 2 negative status: Secondary | ICD-10-CM | POA: Insufficient documentation

## 2023-12-23 DIAGNOSIS — C50312 Malignant neoplasm of lower-inner quadrant of left female breast: Secondary | ICD-10-CM | POA: Insufficient documentation

## 2023-12-23 NOTE — Progress Notes (Signed)
 Patient Care Team: Loreli Elsie JONETTA Mickey., MD as PCP - General (Internal Medicine) Pietro Redell RAMAN, MD as PCP - Cardiology (Cardiology) Odean Potts, MD as Consulting Physician (Hematology and Oncology) Ebbie Cough, MD as Consulting Physician (General Surgery) Shannon Agent, MD as Consulting Physician (Radiation Oncology)  DIAGNOSIS:  Encounter Diagnosis  Name Primary?   Malignant neoplasm of lower-outer quadrant of right breast of female, estrogen receptor positive (HCC) Yes    SUMMARY OF ONCOLOGIC HISTORY: Oncology History  Malignant neoplasm of lower-outer quadrant of right breast of female, estrogen receptor positive (HCC)  10/16/2022 Initial Diagnosis   Palpable right breast mass posteriorly with calcifications.  The calcifications were benign.  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%, PR 100%, Ki67 10%, HER2 IHC negative   10/30/2022 Cancer Staging   Staging form: Breast, AJCC 8th Edition - Clinical: Stage IA (cT1b, cN0, cM0, G1, ER+, PR+, HER2-) - Signed by Odean Potts, MD on 10/30/2022 Histologic grading system: 3 grade system   11/11/2022 Surgery   Right lumpectomy: Grade 1 IDC 0.1 cm, DCIS 0.8 cm, Marg negative LVI not identified, ER 95%, PR 100%, HER2 negative 1+ by IHC, Ki-67 10%   11/11/2022 Cancer Staging   Staging form: Breast, AJCC 8th Edition - Pathologic stage from 11/11/2022: Stage IA (pT1a, pN0, cM0, G1, ER+, PR+, HER2-) - Signed by Crawford Morna Pickle, NP on 06/30/2023 Stage prefix: Initial diagnosis Histologic grading system: 3 grade system   04/2023 -  Anti-estrogen oral therapy   1 mg Anastrozole      CHIEF COMPLIANT: Follow-up of history of breast cancer  HISTORY OF PRESENT ILLNESS:  Crystal Galvan 73 year old with above-mentioned history of breast cancer underwent lumpectomy about a year ago and could not take antiestrogen therapy because of multiple health issues  including profound hot flashes that did not get better in spite of increasing dosages of Effexor .  She was seen at [facility] and was started on estrogen replacement therapy.  Since that time her hot flashes have subsided.  Her mood swings and depression is also improved significantly. ALLERGIES:  is allergic to erythromycin, sulfa antibiotics, pollen extract, and sulfamethoxazole-trimethoprim.  MEDICATIONS:  Current Outpatient Medications  Medication Sig Dispense Refill   busPIRone (BUSPAR) 10 MG tablet Take 10 mg by mouth 2 (two) times daily.     cholecalciferol (VITAMIN D) 1000 UNITS tablet Take 1,000 Units by mouth daily.     dipyridamole (PERSANTINE) 75 MG tablet Take by mouth.     doxycycline (MONODOX) 100 MG capsule Take 100 mg by mouth 2 (two) times daily.     estradiol  (VIVELLE -DOT) 0.025 MG/24HR Place 1 patch onto the skin.     ESTRIOL-PROGESTERONE  MICRO TD Place 1 mL onto the skin.     itraconazole (SPORANOX) 100 MG capsule Take 100 mg by mouth 2 (two) times daily.     metFORMIN (GLUCOPHAGE) 500 MG tablet Take 500 mg by mouth 2 (two) times daily. (Patient taking differently: Take 500 mg by mouth daily.)     metoprolol  succinate (TOPROL -XL) 25 MG 24 hr tablet Take 25 mg by mouth daily.     mupirocin ointment (BACTROBAN) 2 % Apply topically 2 (two) times daily.     NP THYROID  90 MG tablet 90 mg daily.     progesterone  (ENDOMETRIN ) 100 MG vaginal insert Place 100 mg vaginally 2 (two) times daily.     testosterone  (ANDROGEL ) 50 MG/5GM (1%) GEL Place 0.5  g onto the skin daily.     thyroid  (ARMOUR) 30 MG tablet Take 30 mg by mouth daily before breakfast.     TRINTELLIX 5 MG TABS tablet Take 5 mg by mouth daily.     Turmeric (QC TUMERIC COMPLEX) 500 MG CAPS Take by mouth.     No current facility-administered medications for this visit.    PHYSICAL EXAMINATION: ECOG PERFORMANCE STATUS: 1 - Symptomatic but completely ambulatory  Vitals:   12/23/23 1403  BP: 122/78  Pulse: (!)  59  Resp: 18  Temp: 98.2 F (36.8 C)  SpO2: 100%   Filed Weights   12/23/23 1403  Weight: 131 lb 4.8 oz (59.6 kg)      LABORATORY DATA:  I have reviewed the data as listed    Latest Ref Rng & Units 06/30/2023   10:12 AM 10/30/2022   12:33 PM  CMP  Glucose 70 - 99 mg/dL 98  99   BUN 8 - 23 mg/dL 21  20   Creatinine 9.55 - 1.00 mg/dL 9.03  9.19   Sodium 864 - 145 mmol/L 140  141   Potassium 3.5 - 5.1 mmol/L 4.1  4.3   Chloride 98 - 111 mmol/L 105  105   CO2 22 - 32 mmol/L 29  31   Calcium 8.9 - 10.3 mg/dL 9.4  9.5   Total Protein 6.5 - 8.1 g/dL 6.4  6.9   Total Bilirubin 0.0 - 1.2 mg/dL 0.5  0.6   Alkaline Phos 38 - 126 U/L 32  42   AST 15 - 41 U/L 21  18   ALT 0 - 44 U/L 16  12     Lab Results  Component Value Date   WBC 5.1 06/30/2023   HGB 12.9 06/30/2023   HCT 37.4 06/30/2023   MCV 90.8 06/30/2023   PLT 245 06/30/2023   NEUTROABS 2.9 06/30/2023    ASSESSMENT & PLAN:  Malignant neoplasm of lower-outer quadrant of right breast of female, estrogen receptor positive (HCC) 11/11/2022:Right lumpectomy: Grade 1 IDC 0.1 cm, DCIS 0.8 cm, Marg negative LVI not identified, ER 95%, PR 100%, HER2 negative 1+ by IHC, Ki-67 10%    Treatment plan: Adjuvant radiation therapy: Decided not to do XRT because of wound care issues. Followed by adjuvant antiestrogen therapy (prior to the diagnosis she was on estrogen replacement therapy.)  ------------------------------------- Current treatment: Anastrozole  1 mg daily (patient has not started): worried about hot flashes   Mammograms Aug 2025 Signatera: Negative   Hot flashes and her mood swings: Subsided after starting estrogen replacement therapy with Dr. Dorothea I reassured her that it is okay to take estrogen replacement since it was making her life extremely miserable. She is relieved to hear that.  She will return back to see us  in 1 year  No orders of the defined types were placed in this encounter.  The patient has a  good understanding of the overall plan. she agrees with it. she will call with any problems that may develop before the next visit here.  I personally spent a total of 30 minutes in the care of the patient today including preparing to see the patient, getting/reviewing separately obtained history, performing a medically appropriate exam/evaluation, counseling and educating, placing orders, referring and communicating with other health care professionals, documenting clinical information in the EHR, independently interpreting results, communicating results, and coordinating care.   Viinay K Zoye Chandra, MD 12/23/23

## 2023-12-23 NOTE — Assessment & Plan Note (Signed)
 11/11/2022:Right lumpectomy: Grade 1 IDC 0.1 cm, DCIS 0.8 cm, Marg negative LVI not identified, ER 95%, PR 100%, HER2 negative 1+ by IHC, Ki-67 10%    Treatment plan: Adjuvant radiation therapy: Decided not to do XRT because of wound care issues. Followed by adjuvant antiestrogen therapy (prior to the diagnosis she was on estrogen replacement therapy.)  ------------------------------------- Current treatment: Anastrozole 1 mg daily (patient has not started): worried about hot flashes  Mammograms Aug 2025 Signatera: Negative  Wound Healing: Finally better  Hot flashes and her mood swings: Increased dose of Effexor to 2 tabs daily (if she tolerates it, then I will send her the 75 mg prescription)  We will wait to hear back when she is ready to start antiestrogen therapy.  At this time because of the severity of hot flashes she is not in a place to initiate such treatment.

## 2023-12-30 ENCOUNTER — Ambulatory Visit: Admitting: Hematology and Oncology

## 2023-12-30 DIAGNOSIS — M5416 Radiculopathy, lumbar region: Secondary | ICD-10-CM | POA: Diagnosis not present

## 2024-01-08 ENCOUNTER — Encounter: Payer: Self-pay | Admitting: Cardiology

## 2024-01-08 ENCOUNTER — Ambulatory Visit: Attending: Cardiology | Admitting: Cardiology

## 2024-01-08 VITALS — BP 112/60 | HR 61 | Ht 68.0 in | Wt 129.8 lb

## 2024-01-08 DIAGNOSIS — R0602 Shortness of breath: Secondary | ICD-10-CM

## 2024-01-08 DIAGNOSIS — I340 Nonrheumatic mitral (valve) insufficiency: Secondary | ICD-10-CM

## 2024-01-08 DIAGNOSIS — R002 Palpitations: Secondary | ICD-10-CM | POA: Diagnosis not present

## 2024-01-08 DIAGNOSIS — I2510 Atherosclerotic heart disease of native coronary artery without angina pectoris: Secondary | ICD-10-CM

## 2024-01-08 DIAGNOSIS — I10 Essential (primary) hypertension: Secondary | ICD-10-CM | POA: Diagnosis not present

## 2024-01-08 NOTE — Progress Notes (Signed)
 Cardiology Office Note:  .   Date:  01/08/2024  ID:  Crystal Galvan, DOB Jul 03, 1950, MRN [id number] PCP: Loreli Elsie JONETTA Mickey., MD  [facility] Providers Cardiologist:  Redell Shallow, MD {  History of Present Illness: Crystal Galvan is a 73 y.o. female with history of moderate non-obstructive CAD noted on coronary CTA in 07/2022, palpitations with short runs of paroxysmal SVT and rare PACs/ PVCs noted on monitor in 08/2022, mild mitral regurgitation, hyperlipidemia, hypothyroidism, breast cancer status post lumpectomy, and depression.     CAD 06/2021 CAC scoring 225 06/2022 reported intermittent chest heaviness with increased DOE.  Coronary CTA CAC 246, moderate stenosis.  FFR negative in all major vessels.  Palpitations 08/2022 36 beats of SVT, longest lasting 16 beats.  Symptoms corresponded with +-45 with NSVT episodes.  Social history  Playing pickle ball, walking her dogs, treadmill.  Gyms 2 times per week.  Active 3-4 times per week. Non-smoker. Mother with MI.  Sister had stent placement.     Patient with history of nonobstructive CAD noted on coronary CTA and negative by FFR.  Last seen in office 09/2023 and doing well from a cardiac standpoint and physically active.  Palpitations were occasional but brief.  Reported shortness of breath when going up an incline/strenuous activities.  She had declined statin therapy.  She recently had messaged our office requesting stress testing.  Today patient presents for follow-up.  She reports that recently she feels that she has been more short of breath than usual and finding that taking a breath is more difficult for her.  Symptoms going on for the last 3 months or so (around same time of last appt).  She still remains to be active but has noticed a difference in rate of perceived exertion.  It is difficult for her to explain in detail or provide a comparison of her previous activity level because recently she has had issues with lumbar  radiculopathy and had a cyst on one of her facets and had an injection so she has been less active.  She is still doing the usual things she did before but with slightly more difficulty.  However, has denied any chest pain, peripheral edema, orthopnea.  With her sister also going through coronary artery disease and required stenting this worries her.  Additionally continues to have palpitations nocturnally but occasional.  Additionally she has brought in lab work with elevated levels of metals that were drawn for the last 2 years that her integrative medicine doctor ordered.   ROS: Denies: Chest pain, orthopnea, peripheral edema, palpitations, decreased exercise intolerance, fatigue, lightheadedness.   Studies Reviewed: .         Risk Assessment/Calculations:             Physical Exam:   VS:  BP 112/60   Pulse 61   Ht 5' 8 (1.727 m)   Wt 129 lb 12.8 oz (58.9 kg)   SpO2 98%   BMI 19.74 kg/m    Wt Readings from Last 3 Encounters:  01/08/24 129 lb 12.8 oz (58.9 kg)  12/23/23 131 lb 4.8 oz (59.6 kg)  09/10/23 133 lb 6.4 oz (60.5 kg)    GEN: Well nourished, well developed in no acute distress NECK: No JVD; No carotid bruits CARDIAC: RRR, no murmurs, rubs, gallops RESPIRATORY:  Clear to auscultation without rales, wheezing or rhonchi  ABDOMEN: Soft, non-tender, non-distended EXTREMITIES:  No edema; No deformity   ASSESSMENT AND PLAN: .  Mild SOB Nonobstructive CAD Hyperlipidemia -Coronary CTA in 07/2022 showed a coronary calcium score of 246. Negative FFR in all 3 vessels.  She is reporting increase in shortness of breath but still very active and with good exercise capacity.  Difficult to get much detail of her symptoms so the differential for this is extensive but with known nonobstructive CAD further progressive disease is on the differential.  She is also been recovering from back related issues and has not been doing as much so she thinks possibly little  deconditioned. Continue with aspirin. We had a long discussion about different modalities to assess her symptoms.  Has had borderline FFR.  NM PET stress test was offered but she worries about the radio active tracer and wants to think about this more before pursuing.  She will let us  know if she wants to pursue this. LDL was 136 07/2023.  She has declined statin therapy previously and has read about its side effects and has deferred again after extensive discussion. Getting CBC and BMP and TSH to rule out other causes.  Getting echocardiogram.  Palpitations Brief runs of SVT on heart monitor 08/2022, longest lasting 16 beats.  Symptoms overall controlled on beta-blocker. Continue Toprol -XL 25 mg daily.  Mild mitral regurgitation Noted 05/2021. Continue echocardiogram with worsening symptoms of shortness of breath.  Euvolemic.      Dispo: Follow-up in 3 to 4 months to go over results and consider stress testing at that time.  Signed, Thom LITTIE Sluder, PA-C

## 2024-01-08 NOTE — Patient Instructions (Signed)
 Medication Instructions:  Your physician recommends that you continue on your current medications as directed. Please refer to the Current Medication list given to you today.  *If you need a refill on your cardiac medications before your next appointment, please call your pharmacy*  Lab Work: Today CBC, BMP, TSH  If you have labs (blood work) drawn today and your tests are completely normal, you will receive your results only by: MyChart Message (if you have MyChart) OR A paper copy in the mail If you have any lab test that is abnormal or we need to change your treatment, we will call you to review the results.  Testing/Procedures:  Echocardiogram  Your physician has requested that you have an echocardiogram. Echocardiography is a painless test that uses sound waves to create images of your heart. It provides your doctor with information about the size and shape of your heart and how well your heart's chambers and valves are working. This procedure takes approximately one hour. There are no restrictions for this procedure. Please do NOT wear cologne, perfume, aftershave, or lotions (deodorant is allowed). Please arrive 15 minutes prior to your appointment time.  Please note: We ask at that you not bring children with you during ultrasound (echo/ vascular) testing. Due to room size and safety concerns, children are not allowed in the ultrasound rooms during exams. Our front office staff cannot provide observation of children in our lobby area while testing is being conducted. An adult accompanying a patient to their appointment will only be allowed in the ultrasound room at the discretion of the ultrasound technician under special circumstances. We apologize for any inconvenience.   Follow-Up:  At [facility], you and your health needs are our priority.  As part of our continuing mission to provide you with exceptional heart care, our providers are all part of one team.  This team  includes your primary Cardiologist (physician) and Advanced Practice Providers or APPs (Physician Assistants and Nurse Practitioners) who all work together to provide you with the care you need, when you need it.  Your next appointment:   3 month(s)  Provider:   Redell Shallow, MD  or Thom Sluder, PA    We recommend signing up for the patient portal called MyChart.  Sign up information is provided on this After Visit Summary.  MyChart is used to connect with patients for Virtual Visits (Telemedicine).  Patients are able to view lab/test results, encounter notes, upcoming appointments, etc.  Non-urgent messages can be sent to your provider as well.   To learn more about what you can do with MyChart, go to [email or web address].   Other Instructions None

## 2024-01-09 LAB — BASIC METABOLIC PANEL WITH GFR
BUN/Creatinine Ratio: 29 — ABNORMAL HIGH (ref 12–28)
BUN: 23 mg/dL (ref 8–27)
CO2: 23 mmol/L (ref 20–29)
Calcium: 9.6 mg/dL (ref 8.7–10.3)
Chloride: 102 mmol/L (ref 96–106)
Creatinine, Ser: 0.8 mg/dL (ref 0.57–1.00)
Glucose: 92 mg/dL (ref 70–99)
Potassium: 4.9 mmol/L (ref 3.5–5.2)
Sodium: 140 mmol/L (ref 134–144)
eGFR: 78 mL/min/1.73 (ref >59)

## 2024-01-09 LAB — CBC
Hematocrit: 45.4 % (ref 34.0–46.6)
Hemoglobin: 15.2 g/dL (ref 11.1–15.9)
MCH: 32.2 pg (ref 26.6–33.0)
MCHC: 33.5 g/dL (ref 31.5–35.7)
MCV: 96 fL (ref 79–97)
Platelets: 242 x10E3/uL (ref 150–450)
RBC: 4.72 x10E6/uL (ref 3.77–5.28)
RDW: 12.4 % (ref 11.7–15.4)
WBC: 8.8 x10E3/uL (ref 3.4–10.8)

## 2024-01-09 LAB — TSH: TSH: 1.89 u[IU]/mL (ref 0.450–4.500)

## 2024-01-10 ENCOUNTER — Ambulatory Visit: Payer: Self-pay | Admitting: Cardiology

## 2024-01-12 NOTE — Telephone Encounter (Signed)
 Lvm of  results and clinic number if any questions or stress testing reccomendations

## 2024-01-12 NOTE — Telephone Encounter (Signed)
-----   Message from Thom LITTIE Sluder sent at 01/10/2024  8:01 AM EST ----- [name] all your labs look great.  Please let me know what you decide if you want to pursue stress testing or if there are any other questions I can answer thanks. ----- Message ----- From: Interface, [facility] Lab Results In Sent: 01/09/2024   1:35 AM EST To: Thom LITTIE Sluder, PA-C

## 2024-01-16 NOTE — Telephone Encounter (Signed)
 Will forward to [facility] O CMA

## 2024-01-22 DIAGNOSIS — C50511 Malignant neoplasm of lower-outer quadrant of right female breast: Secondary | ICD-10-CM | POA: Diagnosis not present

## 2024-01-22 DIAGNOSIS — Z170 Estrogen receptor positive status [ER+]: Secondary | ICD-10-CM | POA: Diagnosis not present

## 2024-01-26 ENCOUNTER — Telehealth: Payer: Self-pay | Admitting: Cardiology

## 2024-01-26 NOTE — Telephone Encounter (Signed)
 Attempted to contact patient to discuss her concerns.pt is currently scheduled for echo - no orders seen for any type of stress testing.  Advised to call back to discuss or she can send a message through MyChart.

## 2024-01-26 NOTE — Telephone Encounter (Signed)
 Patient is calling to say that she do want to have a stress test done but without the dye. Please advise

## 2024-01-26 NOTE — Telephone Encounter (Signed)
 Spoke with pt who reports at this point she does not want to have the PET stress testing but she will follow through with echo as scheduled.  Advised once the echo results are avaiable, if any further testing is needed she will be contacted.

## 2024-01-31 LAB — SIGNATERA
SIGNATERA MTM READOUT: 0 MTM/ml
SIGNATERA TEST RESULT: NEGATIVE

## 2024-02-09 ENCOUNTER — Ambulatory Visit (HOSPITAL_COMMUNITY)
Admission: RE | Admit: 2024-02-09 | Discharge: 2024-02-09 | Disposition: A | Source: Ambulatory Visit | Attending: Cardiology

## 2024-02-09 DIAGNOSIS — R0602 Shortness of breath: Secondary | ICD-10-CM

## 2024-02-09 LAB — ECHOCARDIOGRAM COMPLETE
AR max vel: 1.94 cm2
AV Area VTI: 1.94 cm2
AV Area mean vel: 1.92 cm2
AV Mean grad: 3 mmHg
AV Peak grad: 5.7 mmHg
Ao pk vel: 1.19 m/s
Area-P 1/2: 4.29 cm2
MV M vel: 1.53 m/s
MV Peak grad: 9.4 mmHg
S' Lateral: 2.74 cm

## 2024-03-30 NOTE — Progress Notes (Signed)
 "    HPI: FU CAD. Coronary CTA May 2024 showed mild disease in the RCA, moderate stenosis in the proximal LAD, minimal plaque in the circumflex, calcium score 246 which was 83rd percentile.  FFR negative.  Monitor June 2024 showed sinus rhythm with PACs, brief runs of SVT and rare PVC.  Echocardiogram December 2025 showed normal LV function, mild mitral regurgitation.  Since last seen, patient notes increased dyspnea on exertion but denies orthopnea, PND or pedal edema.  She has not had chest pain or syncope.  She is very concerned about coronary artery disease as her sister recently had stent placement.  She continues to have occasional palpitations.  Current Outpatient Medications  Medication Sig Dispense Refill   Apple Cid Vn-Grn Tea-Bit Or-Cr (GREEN TEA SLIM WITH EGCG PO) Take 400 mg by mouth daily.     ASPIRIN 81 PO Take 1 tablet by mouth daily.     Berberine Chloride (BERBERINE HCI PO) Take 450 mg by mouth daily.     doxycycline (MONODOX) 100 MG capsule Take 100 mg by mouth 2 (two) times daily.     estradiol  (VIVELLE -DOT) 0.025 MG/24HR Place 1 patch onto the skin.     metFORMIN (GLUCOPHAGE) 500 MG tablet Take 500 mg by mouth 2 (two) times daily. (Patient taking differently: Take 500 mg by mouth daily.)     metoprolol  succinate (TOPROL -XL) 25 MG 24 hr tablet Take 25 mg by mouth daily.     mupirocin ointment (BACTROBAN) 2 % Apply topically 2 (two) times daily.     NP THYROID  60 MG tablet Take 60 mg by mouth every morning.     progesterone  (ENDOMETRIN ) 100 MG vaginal insert Place 100 mg vaginally 2 (two) times daily.     testosterone  (ANDROGEL ) 50 MG/5GM (1%) GEL Place 0.5 g onto the skin daily.     Turmeric (QC TUMERIC COMPLEX) 500 MG CAPS Take by mouth.     VITAMIN D, CHOLECALCIFEROL, PO Take 10,000 Units by mouth daily at 2 am.     No current facility-administered medications for this visit.     Past Medical History:  Diagnosis Date   Breast cancer (HCC)    Complication of  anesthesia    difficult being put to sleep   Coronary artery calcification    Depression    Headache(784.0)    occ   Hypothyroidism     Past Surgical History:  Procedure Laterality Date   BREAST BIOPSY Right 01/26/2020   BREAST BIOPSY Right 01/2019   BREAST BIOPSY Right 10/10/2022   US  RT BREAST BX W LOC DEV 1ST LESION IMG BX SPEC US  GUIDE 10/10/2022 [facility]   BREAST BIOPSY Right 10/10/2022   MM RT BREAST BX W LOC DEV 1ST LESION IMAGE BX SPEC STEREO GUIDE 10/10/2022 [facility]   BREAST BIOPSY Right 10/10/2022   MM RT BREAST BX W LOC DEV EA AD LESION IMG BX SPEC STEREO GUIDE 10/10/2022 [facility]   BREAST BIOPSY Right 10/16/2022   MM RT BREAST BX W LOC DEV 1ST LESION IMAGE BX SPEC STEREO GUIDE 10/16/2022 [facility]   BREAST LUMPECTOMY Right 11/11/2022   Procedure: RIGHT BREAST LUMPECTOMY;  Surgeon: Ebbie Cough, MD;  Location: [facility];  Service: General;  Laterality: Right;   COLONOSCOPY WITH PROPOFOL  N/A 06/01/2012   Procedure: COLONOSCOPY WITH PROPOFOL ;  Surgeon: Gladis MARLA Louder, MD;  Location: [facility];  Service: Endoscopy;  Laterality: N/A;   FACELIFT     KNEE ARTHROSCOPY  03/05/2007   Right  Nasal suirgery      Social History   Socioeconomic History   Marital status: Married    Spouse name: Not on file   Number of children: 2   Years of education: Not on file   Highest education level: Not on file  Occupational History   Not on file  Tobacco Use   Smoking status: Never   Smokeless tobacco: Never  Vaping Use   Vaping status: Never Used  Substance and Sexual Activity   Alcohol  use: Yes    Comment: Occasional   Drug use: No   Sexual activity: Not on file  Other Topics Concern   Not on file  Social History Narrative   Not on file   Social Drivers of Health   Tobacco Use: Low Risk (04/08/2024)   Patient History    Smoking Tobacco Use: Never    Smokeless Tobacco Use: Never    Passive Exposure: Not on file  Financial Resource  Strain: Not on file  Food Insecurity: No Food Insecurity (12/05/2022)   Hunger Vital Sign    Worried About Running Out of Food in the Last Year: Never true    Ran Out of Food in the Last Year: Never true  Transportation Needs: No Transportation Needs (12/05/2022)   PRAPARE - Administrator, Civil Service (Medical): No    Lack of Transportation (Non-Medical): No  Physical Activity: Not on file  Stress: Not on file  Social Connections: Unknown (07/16/2021)   [facility]   Social Network    Social Network: Not on file  Intimate Partner Violence: Not At Risk (12/05/2022)   Humiliation, Afraid, Rape, and Kick questionnaire    Fear of Current or Ex-Partner: No    Emotionally Abused: No    Physically Abused: No    Sexually Abused: No  Depression (PHQ2-9): Low Risk (12/05/2022)   Depression (PHQ2-9)    PHQ-2 Score: 4  Alcohol  Screen: Not on file  Housing: Unknown (03/07/2023)   Received from [facility]   Epic    Unable to Pay for Housing in the Last Year: Not on file    Number of Times Moved in the Last Year: Not on file    At any time in the past 12 months, were you homeless or living in a shelter (including now)?: No  Utilities: Not At Risk (12/05/2022)   [employer] Utilities    Threatened with loss of utilities: No  Health Literacy: Not on file    Family History  Problem Relation Age of Onset   Heart attack Mother    Breast cancer Neg Hx     ROS: no fevers or chills, productive cough, hemoptysis, dysphasia, odynophagia, melena, hematochezia, dysuria, hematuria, rash, seizure activity, orthopnea, PND, pedal edema, claudication. Remaining systems are negative.  Physical Exam: Well-developed well-nourished in no acute distress.  Skin is warm and dry.  HEENT is normal.  Neck is supple.  Chest is clear to auscultation with normal expansion.  Cardiovascular exam is regular rate and rhythm.  Abdominal exam nontender or distended. No masses  palpated. Extremities show no edema. neuro grossly intact   A/P  1 coronary artery disease-previous CTA showed moderate disease in the LAD but FFR negative. Continue aspirin.  She declines statins.  She notes increased dyspnea on exertion.  She is concerned about coronary disease as her sister recently had a stent to her right coronary artery by her report.  I recommended a stress PET or repeat coronary  CTA to further assess dyspnea.  However she is extremely concerned about the potential for radiation.  She asked about a stress echocardiogram which certainly could be performed but I think has less sensitivity and specificity for our office.  She would like to consider her options before proceeding.  She will contact us  if she is agreeable to further testing and when to proceed.  2 hyperlipidemia-patient has declined statins or other lipid-lowering medications and we will therefore avoid.  Continue diet.  I did recommend medication and discussed the importance of lowering cholesterol to decrease plaque progression.  3 palpitations-previous monitor as outlined above.  Continue Toprol .  She does note occasional palpitations which are persistent.  Redell Shallow, MD    "

## 2024-04-01 ENCOUNTER — Telehealth: Payer: Self-pay | Admitting: Cardiology

## 2024-04-01 NOTE — Telephone Encounter (Signed)
 Called spoke with pt reports spoke with PCP regarding an increase in palpitations.  Expresses PCP recommends pt have a stress echo.  Pt asking to have appointment for Monday switched to stress echo.  Advised next visit is with Dr. Pietro on 04/08/24 not for an echo.  Pt had an echo on 02/09/24.  Wore a heart monitor in 2024.    Reports is now having more palpitations that occur with and without activity.  Would like to know what testing is needed to figure out what is going on.   Advised will send to MD to advise.

## 2024-04-01 NOTE — Telephone Encounter (Signed)
 Pt states she got a text that a stress test was scheduled. Informed her she has an echo 04/08/24 but not a stress test. She states she only wants to have an echo stress test. Informed her I will send it over to triage as the doctor will have to order this. Please advise.

## 2024-04-05 NOTE — Telephone Encounter (Signed)
Left message for patient with Dr Creshaw's recommendations.   

## 2024-04-08 ENCOUNTER — Ambulatory Visit: Payer: Self-pay | Admitting: Cardiology

## 2024-04-08 ENCOUNTER — Encounter: Payer: Self-pay | Admitting: Cardiology

## 2024-04-08 VITALS — BP 100/60 | HR 60 | Ht 68.0 in | Wt 128.6 lb

## 2024-04-08 DIAGNOSIS — E7800 Pure hypercholesterolemia, unspecified: Secondary | ICD-10-CM | POA: Diagnosis not present

## 2024-04-08 DIAGNOSIS — R002 Palpitations: Secondary | ICD-10-CM | POA: Diagnosis not present

## 2024-04-08 DIAGNOSIS — I2510 Atherosclerotic heart disease of native coronary artery without angina pectoris: Secondary | ICD-10-CM

## 2024-04-08 NOTE — Patient Instructions (Signed)

## 2024-12-21 ENCOUNTER — Inpatient Hospital Stay: Admitting: Hematology and Oncology
